# Patient Record
Sex: Male | Born: 1965 | Race: Black or African American | Hispanic: No | Marital: Married | Smoking: Never smoker
Health system: Southern US, Community
[De-identification: ages and names within clinical notes are randomized; demographics above are authoritative.]

## PROBLEM LIST (undated history)

## (undated) DIAGNOSIS — I10 Essential (primary) hypertension: Secondary | ICD-10-CM

## (undated) HISTORY — PX: HERNIA REPAIR: SHX51

---

## 2003-05-11 ENCOUNTER — Ambulatory Visit (HOSPITAL_COMMUNITY): Admission: RE | Admit: 2003-05-11 | Discharge: 2003-05-11 | Payer: Self-pay | Admitting: General Surgery

## 2010-11-04 ENCOUNTER — Inpatient Hospital Stay (INDEPENDENT_AMBULATORY_CARE_PROVIDER_SITE_OTHER)
Admission: RE | Admit: 2010-11-04 | Discharge: 2010-11-04 | Disposition: A | Discharge status: Home / Self Care | Payer: 59 | Source: Ambulatory Visit | Attending: Family Medicine | Admitting: Family Medicine

## 2010-11-04 DIAGNOSIS — T148XXA Other injury of unspecified body region, initial encounter: Secondary | ICD-10-CM

## 2010-11-12 ENCOUNTER — Inpatient Hospital Stay (HOSPITAL_COMMUNITY)
Admission: RE | Admit: 2010-11-12 | Discharge: 2010-11-12 | Disposition: A | Discharge status: Home / Self Care | Payer: 59 | Source: Ambulatory Visit | Attending: Family Medicine | Admitting: Family Medicine

## 2013-09-28 ENCOUNTER — Emergency Department (HOSPITAL_COMMUNITY)
Admission: EM | Admit: 2013-09-28 | Discharge: 2013-09-28 | Disposition: A | Discharge status: Home / Self Care | Payer: Managed Care, Other (non HMO) | Source: Home / Self Care

## 2013-09-28 ENCOUNTER — Encounter (HOSPITAL_COMMUNITY): Payer: Self-pay | Admitting: Emergency Medicine

## 2013-09-28 DIAGNOSIS — S8392XA Sprain of unspecified site of left knee, initial encounter: Secondary | ICD-10-CM

## 2013-09-28 DIAGNOSIS — IMO0002 Reserved for concepts with insufficient information to code with codable children: Secondary | ICD-10-CM

## 2013-09-28 HISTORY — DX: Essential (primary) hypertension: I10

## 2013-09-28 MED ORDER — DICLOFENAC POTASSIUM 50 MG PO TABS: 50.0000 mg | ORAL_TABLET | Freq: Three times a day (TID) | ORAL | Status: DC

## 2013-09-28 MED ORDER — TRAMADOL HCL 50 MG PO TABS: 50.0000 mg | ORAL_TABLET | Freq: Four times a day (QID) | ORAL | Status: DC | PRN

## 2013-09-28 NOTE — ED Provider Notes (Signed)
Medical screening examination/treatment/procedure(s) were performed by resident physician or non-physician practitioner and as supervising physician I was immediately available for consultation/collaboration.   Barkley BrunsKINDL,Braedon Sjogren DOUGLAS MD.   Linna HoffJames D Cassundra Mckeever, MD 09/28/13 [phone]

## 2013-09-28 NOTE — Discharge Instructions (Signed)
Joint Sprain A sprain is a tear or stretch in the ligaments that hold a joint together. Severe sprains may need as long as 3-6 weeks of immobilization and/or exercises to heal completely. Sprained joints should be rested and protected. If not, they can become unstable and prone to re-injury. Proper treatment can reduce your pain, shorten the period of disability, and reduce the risk of repeated injuries. TREATMENT   Rest and elevate the injured joint to reduce pain and swelling.  Apply ice packs to the injury for 20-30 minutes every 2-3 hours for the next 2-3 days.  Keep the injury wrapped in a compression bandage or splint as long as the joint is painful or as instructed by your caregiver.  Do not use the injured joint until it is completely healed to prevent re-injury and chronic instability. Follow the instructions of your caregiver.  Long-term sprain management may require exercises and/or treatment by a physical therapist. Taping or special braces may help stabilize the joint until it is completely better. SEEK MEDICAL CARE IF:   You develop increased pain or swelling of the joint.  You develop increasing redness and warmth of the joint.  You develop a fever.  It becomes stiff.  Your hand or foot gets cold or numb. Document Released: 07/18/2004 Document Revised: 09/02/2011 Document Reviewed: 06/27/2008 [facility] Patient Information 2014 [employer], [place].  Knee Sprain A knee sprain is a tear in one of the strong, fibrous tissues that connect the bones (ligaments) in your knee. The severity of the sprain depends on how much of the ligament is torn. The tear can be either partial or complete. CAUSES  Often, sprains are a result of a fall or injury. The force of the impact causes the fibers of your ligament to stretch too much. This excess tension causes the fibers of your ligament to tear. SIGNS AND SYMPTOMS  You may have some loss of motion in your knee. Other symptoms  include:  Bruising.  Pain in the knee area.  Tenderness of the knee to the touch.  Swelling. DIAGNOSIS  To diagnose a knee sprain, your health care provider will physically examine your knee. Your health care provider may also suggest an X-ray exam of your knee to make sure no bones are broken. TREATMENT  If your ligament is only partially torn, treatment usually involves keeping the knee in a fixed position (immobilization) or bracing your knee for activities that require movement for several weeks. To do this, your health care provider will apply a bandage, cast, or splint to keep your knee from moving and to support your knee during movement until it heals. For a partially torn ligament, the healing process usually takes 4 6 weeks. If your ligament is completely torn, depending on which ligament it is, you may need surgery to reconnect the ligament to the bone or reconstruct it. After surgery, a cast or splint may be applied and will need to stay on your knee for 4 6 weeks while your ligament heals. HOME CARE INSTRUCTIONS  Keep your injured knee elevated to decrease swelling.  To ease pain and swelling, apply ice to the injured area:  Put ice in a plastic bag.  Place a towel between your skin and the bag.  Leave the ice on for 20 minutes, 2 3 times a day.  Only take medicine for pain as directed by your health care provider.  Do not leave your knee unprotected until pain and stiffness go away (usually 4 6 weeks).  If you have a cast or splint, do not allow it to get wet. If you have been instructed not to remove it, cover it with a plastic bag when you shower or bathe. Do not swim.  Your health care provider may suggest exercises for you to do during your recovery to prevent or limit permanent weakness and stiffness. SEEK IMMEDIATE MEDICAL CARE IF:  Your cast or splint becomes damaged.  Your pain becomes worse.  You have significant pain, swelling, or numbness below the cast  or splint. MAKE SURE YOU:  Understand these instructions.  Will watch your condition.  Will get help right away if you are not doing well or get worse. Document Released: 06/10/2005 Document Revised: 03/31/2013 Document Reviewed: 01/20/2013 [facility] Patient Information 2014 [place], [place].

## 2013-09-28 NOTE — ED Notes (Signed)
Reports injury to right knee on 3/28 while bowling.  States heard pop in knee and has been having pain since.  Pt has tried ibuprofen with no relief.

## 2013-09-28 NOTE — ED Provider Notes (Signed)
CSN: [id number]     Arrival date & time 09/28/13  1239 History   First MD Initiated Contact with Patient 09/28/13 1530     Chief Complaint  Patient presents with  . Knee Injury   (Consider location/radiation/quality/duration/timing/severity/associated sxs/prior Treatment) HPI Comments: 48 year old male who was bowling 10 days ago and felt a pop in his left knee. Shortly afterwards he developed pain in the proximal left knee. It has been bothering him ever since. He has been placing ice on the knee and taking NSAIDs.   Past Medical History  Diagnosis Date  . Hypertension    Past Surgical History  Procedure Laterality Date  . Hernia repair     History reviewed. No pertinent family history. History  Substance Use Topics  . Smoking status: Never Smoker   . Smokeless tobacco: Not on file  . Alcohol Use: No    Review of Systems  Constitutional: Negative.   Respiratory: Negative.   Genitourinary: Negative.   Musculoskeletal:       As per HPI  Skin: Negative.   Neurological: Negative for dizziness and numbness.    Allergies  Review of patient's allergies indicates no known allergies.  Home Medications   Current Outpatient Rx  Name  Route  Sig  Dispense  Refill  . lisinopril (PRINIVIL,ZESTRIL) 20 MG tablet   Oral   Take 20 mg by mouth daily.         . diclofenac (CATAFLAM) 50 MG tablet   Oral   Take 1 tablet (50 mg total) by mouth 3 (three) times daily.   21 tablet   0   . traMADol (ULTRAM) 50 MG tablet   Oral   Take 1 tablet (50 mg total) by mouth every 6 (six) hours as needed.   20 tablet   0    BP 155/99  Pulse 63  Temp(Src) 98.2 F (36.8 C) (Oral)  Resp 12  SpO2 100% Physical Exam  Nursing note and vitals reviewed. Constitutional: He is oriented to person, place, and time. He appears well-developed and well-nourished.  HENT:  Head: Normocephalic and atraumatic.  Eyes: EOM are normal.  Neck: Normal range of motion. Neck supple.  Pulmonary/Chest:  Effort normal. No respiratory distress.  Musculoskeletal:  Left knee without deformity, discoloration or swelling. No apparent joint effusion. Medial lateral joint spaces are easily indented. There is tenderness along the distal medial quadriceps tendon. No bony tenderness of the patella or tibia. Normal flexion and extension. No laxity. Negative drawer. Negative varus and negative valgus. Distal neurovascular motor sensory is intact. Pedal pulses 2+.  Neurological: He is alert and oriented to person, place, and time. No cranial nerve deficit.  Skin: Skin is warm and dry.  Psychiatric: He has a normal mood and affect.    ED Course  Procedures (including critical care time) Labs Review Labs Reviewed - No data to display Imaging Review No results found.   MDM   1. Sprain of left knee      Ice, immobilization for a few days Remove periodically for rehab of knee as demo'd. cataflam and tramadol for pain.     Hayden Rasmussenavid Tiwanda Threats, NP 09/28/13 1558

## 2016-06-25 ENCOUNTER — Ambulatory Visit (HOSPITAL_COMMUNITY)
Admission: EM | Admit: 2016-06-25 | Discharge: 2016-06-25 | Disposition: A | Discharge status: Home / Self Care | Payer: Managed Care, Other (non HMO) | Attending: Family Medicine | Admitting: Family Medicine

## 2016-06-25 ENCOUNTER — Encounter (HOSPITAL_COMMUNITY): Payer: Self-pay | Admitting: Emergency Medicine

## 2016-06-25 DIAGNOSIS — A084 Viral intestinal infection, unspecified: Secondary | ICD-10-CM

## 2016-06-25 MED ORDER — ONDANSETRON HCL 4 MG PO TABS: 4.0000 mg | ORAL_TABLET | Freq: Four times a day (QID) | ORAL | 0 refills | Status: DC

## 2016-06-25 NOTE — ED Provider Notes (Signed)
CSN: [id number]     Arrival date & time 06/25/16  1347 History   First MD Initiated Contact with Patient 06/25/16 1504     Chief Complaint  Patient presents with  . Abdominal Pain   (Consider location/radiation/quality/duration/timing/severity/associated sxs/prior Treatment)  51 year old male complaining of mid abdominal cramping/pain for 3 days. Associated with watery diarrhea too numerous to count. He states he has seen no blood in the stool. A couple days ago and started he would feel cold and sweaty but no fever. His also had headache and occasional nausea but no vomiting. He states today the number of stools have decreased to about 4.      Past Medical History:  Diagnosis Date  . Hypertension    Past Surgical History:  Procedure Laterality Date  . HERNIA REPAIR     No family history on file. Social History  Substance Use Topics  . Smoking status: Never Smoker  . Smokeless tobacco: Not on file  . Alcohol use No    Review of Systems  Constitutional: Positive for activity change. Negative for fever.  HENT: Negative.   Respiratory: Negative.   Cardiovascular: Negative for chest pain.  Gastrointestinal: Positive for abdominal pain, diarrhea and nausea. Negative for vomiting.  Musculoskeletal: Negative.   Neurological: Negative.   All other systems reviewed and are negative.   Allergies  Patient has no known allergies.  Home Medications   Prior to Admission medications   Medication Sig Start Date End Date Taking? Authorizing Provider  diclofenac (CATAFLAM) 50 MG tablet Take 1 tablet (50 mg total) by mouth 3 (three) times daily. 09/28/13   Hayden Rasmussenavid Bland Rudzinski, NP  lisinopril (PRINIVIL,ZESTRIL) 20 MG tablet Take 20 mg by mouth daily.    Historical Provider, MD  ondansetron (ZOFRAN) 4 MG tablet Take 1 tablet (4 mg total) by mouth every 6 (six) hours. 06/25/16   Hayden Rasmussenavid Kasper Mudrick, NP  traMADol (ULTRAM) 50 MG tablet Take 1 tablet (50 mg total) by mouth every 6 (six) hours as needed. 09/28/13    Hayden Rasmussenavid Dodi Leu, NP   Meds Ordered and Administered this Visit  Medications - No data to display  BP 141/71   Pulse 79   Temp 98.5 F (36.9 C)   Resp 16   SpO2 98%  No data found.   Physical Exam  Constitutional: He is oriented to person, place, and time. He appears well-developed and well-nourished. No distress.  HENT:  Head: Normocephalic and atraumatic.  Neck: Neck supple.  Cardiovascular: Normal rate and regular rhythm.   Pulmonary/Chest: Effort normal and breath sounds normal.  Abdominal: Soft. Bowel sounds are normal. There is tenderness. There is no rebound and no guarding.  Tenderness across the left abdomen and periumbilical. Less tenderness to the epigastrium. No right lower quadrant tenderness.  Musculoskeletal: Normal range of motion. He exhibits no edema.  Neurological: He is alert and oriented to person, place, and time.  Skin: Skin is warm and dry.  Nursing note and vitals reviewed.   Urgent Care Course   Clinical Course     Procedures (including critical care time)  Labs Review Labs Reviewed - No data to display  Imaging Review No results found.   Visual Acuity Review  Right Eye Distance:   Left Eye Distance:   Bilateral Distance:    Right Eye Near:   Left Eye Near:    Bilateral Near:         MDM   1. Viral gastroenteritis    Read attached instructions. Drink the Pedialyte  as directed. Try to consume at least 2 quarts in the next 24 hours in addition to other liquids or Jell-O. Started out with small amounts at a time. No greasy, spicy, fatty or fast foods in the next 24-36 hours. Slowly advance diet as discussed.  Take Zofran as needed for nausea. You may use Imodium 1 tablet now and then in 4 hours if you have had 3-4 more stools take another one. Do not use the instructions on the box, that is too much you do not want to stop the diarrhea just slow it down. Meds ordered this encounter  Medications  . ondansetron (ZOFRAN) 4 MG tablet     Sig: Take 1 tablet (4 mg total) by mouth every 6 (six) hours.    Dispense:  12 tablet    Refill:  0    Order Specific Question:   Supervising Provider    Answer:   Micheline Chapman       Hayden Rasmussen, NP 06/25/16 1531

## 2016-06-25 NOTE — ED Triage Notes (Signed)
Triaged by Provider. 

## 2016-06-25 NOTE — Discharge Instructions (Signed)
Read attached instructions. Drink the Pedialyte as directed. Try to consume at least 2 quarts in the next 24 hours in addition to other liquids or Jell-O. Started out with small amounts at a time. No greasy, spicy, fatty or fast foods in the next 24-36 hours. Slowly advance diet as discussed.  Take Zofran as needed for nausea. You may use Imodium 1 tablet now and then in 4 hours if you have had 3-4 more stools take another one. Do not use the instructions on the box, that is too much you do not want to stop the diarrhea just slow it down.

## 2017-11-19 ENCOUNTER — Ambulatory Visit (HOSPITAL_COMMUNITY)
Admission: EM | Admit: 2017-11-19 | Discharge: 2017-11-19 | Disposition: A | Discharge status: Home / Self Care | Payer: 59 | Attending: Family Medicine | Admitting: Family Medicine

## 2017-11-19 ENCOUNTER — Encounter (HOSPITAL_COMMUNITY): Payer: Self-pay | Admitting: Emergency Medicine

## 2017-11-19 DIAGNOSIS — R51 Headache: Secondary | ICD-10-CM | POA: Diagnosis not present

## 2017-11-19 DIAGNOSIS — R42 Dizziness and giddiness: Secondary | ICD-10-CM | POA: Diagnosis not present

## 2017-11-19 DIAGNOSIS — R519 Headache, unspecified: Secondary | ICD-10-CM

## 2017-11-19 MED ORDER — MELOXICAM 7.5 MG PO TABS: 7.5000 mg | ORAL_TABLET | Freq: Every day | ORAL | 0 refills | Status: DC

## 2017-11-19 MED ORDER — FLUTICASONE PROPIONATE 50 MCG/ACT NA SUSP: 2.0000 | Freq: Every day | NASAL | 0 refills | Status: DC

## 2017-11-19 MED ORDER — OMEPRAZOLE 20 MG PO CPDR: 20.0000 mg | DELAYED_RELEASE_CAPSULE | Freq: Every day | ORAL | 0 refills | Status: DC

## 2017-11-19 MED ORDER — KETOROLAC TROMETHAMINE 60 MG/2ML IM SOLN
60.0000 mg | Freq: Once | INTRAMUSCULAR | Status: AC
  Administered 2017-11-19: 60 mg via INTRAMUSCULAR

## 2017-11-19 MED ORDER — KETOROLAC TROMETHAMINE 60 MG/2ML IM SOLN
INTRAMUSCULAR | Status: AC
  Filled 2017-11-19: qty 2

## 2017-11-19 NOTE — ED Provider Notes (Signed)
[facility]    CSN: [id number] Arrival date & time: 11/19/17  1659     History   Chief Complaint Chief Complaint  Patient presents with  . Headache  . Dizziness    HPI William Mccall is a 52 y.o. male.   52 year old male comes in with 5-day history of headache.  Patient states this started as left-sided, pounding that was severe, and intensity has slowly declined throughout the 5 days, but increased today.  States now headache feels more  eye gets behind the left eye, that is stool in nature.  Has intermittent blurry vision while working.  States started having dizziness today, describes it as "woozy", as if he wants to sit down because the room is spinning.  Nausea without vomiting that has since resolved.  Does feel as if he has acid reflux with burning sensation in the epigastric region.  He denies chest pain, shortness of breath, wheezing.  Admits to intermittent palpitations since symptoms started, but does not know any aggravating or alleviating factor.  Denies current palpitations.  Denies urinary symptoms such as cough, congestion, sore throat.  Denies fever, chills, night sweats.  Denies weakness,  syncope.  Denies personal history of heart disease.  Denies family history of heart disease.  States when headache first started, he checked his blood pressure, which was at 178/105.  States he has been taking lisinopril-HCTZ with good compliance.  Take Tylenol without relief.  Denies photophobia, eye crusting.  Denies contact lens use, does wear glasses.     Past Medical History:  Diagnosis Date  . Hypertension     There are no active problems to display for this patient.   Past Surgical History:  Procedure Laterality Date  . HERNIA REPAIR         Home Medications    Prior to Admission medications   Medication Sig Start Date End Date Taking? Authorizing Provider  lisinopril-hydrochlorothiazide (PRINZIDE,ZESTORETIC) 20-25 MG tablet Take 1 tablet by mouth  daily.   Yes [provider]  sertraline (ZOLOFT) 50 MG tablet Take 50 mg by mouth daily.   Yes [provider]  diclofenac (CATAFLAM) 50 MG tablet Take 1 tablet (50 mg total) by mouth 3 (three) times daily. 09/28/13   Hayden Rasmussen, NP  fluticasone (FLONASE) 50 MCG/ACT nasal spray Place 2 sprays into both nostrils daily. 11/19/17   Cathie Hoops, Amy V, PA-C  meloxicam (MOBIC) 7.5 MG tablet Take 1 tablet (7.5 mg total) by mouth daily. 11/19/17   Cathie Hoops, Amy V, PA-C  omeprazole (PRILOSEC) 20 MG capsule Take 1 capsule (20 mg total) by mouth daily. 11/19/17   Cathie Hoops, Amy V, PA-C  ondansetron (ZOFRAN) 4 MG tablet Take 1 tablet (4 mg total) by mouth every 6 (six) hours. 06/25/16   Hayden Rasmussen, NP  traMADol (ULTRAM) 50 MG tablet Take 1 tablet (50 mg total) by mouth every 6 (six) hours as needed. 09/28/13   Hayden Rasmussen, NP    Family History No family history on file.  Social History Social History   Tobacco Use  . Smoking status: Never Smoker  Substance Use Topics  . Alcohol use: No  . Drug use: No     Allergies   Patient has no known allergies.   Review of Systems Review of Systems  Reason unable to perform ROS: See HPI as above.     Physical Exam Triage Vital Signs ED Triage Vitals  Enc Vitals Group     BP 11/19/17 1710 (!)  143/80     Pulse Rate 11/19/17 1710 74     Resp 11/19/17 1710 16     Temp 11/19/17 1710 98.3 F (36.8 C)     Temp src --      SpO2 11/19/17 1710 98 %     Weight --      Height --      Head Circumference --      Peak Flow --      Pain Score 11/19/17 1712 2     Pain Loc --      Pain Edu? --      Excl. in GC? --    No data found.  Updated Vital Signs BP (!) 143/80   Pulse 74   Temp 98.3 F (36.8 C)   Resp 16   SpO2 98%   Visual Acuity Right Eye Distance:   20/20 (CC) Left Eye Distance:   20/30 (CC) Bilateral Distance:    Right Eye Near:   Left Eye Near:    Bilateral Near:     Physical Exam  Constitutional: He is oriented to person, place, and  time. He appears well-developed and well-nourished. No distress.  HENT:  Head: Normocephalic and atraumatic.  Right Ear: Tympanic membrane, external ear and ear canal normal. Tympanic membrane is not erythematous and not bulging.  Left Ear: Tympanic membrane, external ear and ear canal normal. Tympanic membrane is not erythematous and not bulging.  Nose: Right sinus exhibits no maxillary sinus tenderness and no frontal sinus tenderness. Left sinus exhibits maxillary sinus tenderness and frontal sinus tenderness.  Mouth/Throat: Uvula is midline, oropharynx is clear and moist and mucous membranes are normal.  Eyes: Pupils are equal, round, and reactive to light. Conjunctivae are normal.  Neck: Normal range of motion. Neck supple.  Cardiovascular: Normal rate, regular rhythm and normal heart sounds. Exam reveals no gallop and no friction rub.  No murmur heard. Pulmonary/Chest: Effort normal and breath sounds normal. He has no decreased breath sounds. He has no wheezes. He has no rhonchi. He has no rales.  Abdominal: Soft. He exhibits no distension and no mass. There is no tenderness. There is no guarding.  Lymphadenopathy:    He has no cervical adenopathy.  Neurological: He is alert and oriented to person, place, and time. He has normal strength. He is not disoriented. No cranial nerve deficit or sensory deficit. He displays a negative Romberg sign. Coordination and gait normal. GCS eye subscore is 4. GCS verbal subscore is 5. GCS motor subscore is 6.  Normal finger-to-nose, rapid movement.  Skin: Skin is warm and dry.  Psychiatric: He has a normal mood and affect. His behavior is normal. Judgment normal.     UC Treatments / Results  Labs (all labs ordered are listed, but only abnormal results are displayed) Labs Reviewed - No data to display  EKG None  Radiology No results found.  Procedures Procedures (including critical care time)  Medications Ordered in UC Medications    ketorolac (TORADOL) injection 60 mg (60 mg Intramuscular Given 11/19/17 1853)    Initial Impression / Assessment and Plan / UC Course  I have reviewed the triage vital signs and the nursing notes.  Pertinent labs & imaging results that were available during my care of the patient were reviewed by me and considered in my medical decision making (see chart for details).    No alarming signs on exam, normal neurology exam.  EKG normal sinus rhythm, 70 bpm, no acute ST changes.  Discussed possible sinus pressure given tenderness to palpation.  Start Flonase as directed.  Toradol injection in office today.  Push fluids.  Monitor  closely.  Strict return precautions given.  Patient expresses understanding and agrees to plan.  Final Clinical Impressions(s) / UC Diagnoses   Final diagnoses:  Acute intractable headache, unspecified headache type    ED Prescriptions    Medication Sig Dispense Auth. Provider   meloxicam (MOBIC) 7.5 MG tablet Take 1 tablet (7.5 mg total) by mouth daily. 15 tablet Yu, Amy V, PA-C   omeprazole (PRILOSEC) 20 MG capsule Take 1 capsule (20 mg total) by mouth daily. 15 capsule Yu, Amy V, PA-C   fluticasone (FLONASE) 50 MCG/ACT nasal spray Place 2 sprays into both nostrils daily. 1 g Threasa Alpha, PA-C 11/20/17 1005

## 2017-11-19 NOTE — ED Triage Notes (Signed)
Pt c/o headache for the last few days, on Sunday he took his bp and it was 170s. Pt states today at work his head still has been hurting and his vision is blurred. Pt also feels a little lightheaded. Ambulatory with steady gait.

## 2017-11-19 NOTE — Discharge Instructions (Signed)
No alarming signs on your exam. EKG and neurology exam normal. Toradol injection to help with pain. Mobic as needed for headache. Omeprazole for acid reflux. Flonase to help with nasal congestion/sinus pressure. If experiencing worsening of symptoms, worsening headache/blurry vision, nausea/vomiting, confusion/altered mental status, dizziness, weakness, passing out, imbalance, chest pain, shortness of breath, palpitations, go to the emergency department for further evaluation.

## 2017-12-24 ENCOUNTER — Emergency Department (HOSPITAL_COMMUNITY)
Admission: EM | Admit: 2017-12-24 | Discharge: 2017-12-24 | Disposition: A | Discharge status: Home / Self Care | Payer: 59 | Attending: Emergency Medicine | Admitting: Emergency Medicine

## 2017-12-24 ENCOUNTER — Other Ambulatory Visit: Payer: Self-pay

## 2017-12-24 ENCOUNTER — Emergency Department (HOSPITAL_COMMUNITY): Payer: 59

## 2017-12-24 ENCOUNTER — Encounter (HOSPITAL_COMMUNITY): Payer: Self-pay | Admitting: Emergency Medicine

## 2017-12-24 DIAGNOSIS — R519 Headache, unspecified: Secondary | ICD-10-CM

## 2017-12-24 DIAGNOSIS — I10 Essential (primary) hypertension: Secondary | ICD-10-CM | POA: Insufficient documentation

## 2017-12-24 DIAGNOSIS — R51 Headache: Secondary | ICD-10-CM | POA: Diagnosis not present

## 2017-12-24 DIAGNOSIS — G44229 Chronic tension-type headache, not intractable: Secondary | ICD-10-CM

## 2017-12-24 LAB — COMPREHENSIVE METABOLIC PANEL
ALT: 47 U/L — ABNORMAL HIGH (ref 0–44)
AST: 40 U/L (ref 15–41)
Albumin: 4.2 g/dL (ref 3.5–5.0)
Alkaline Phosphatase: 56 U/L (ref 38–126)
Anion gap: 9 (ref 5–15)
BUN: 15 mg/dL (ref 6–20)
CO2: 31 mmol/L (ref 22–32)
Calcium: 9.9 mg/dL (ref 8.9–10.3)
Chloride: 101 mmol/L (ref 98–111)
Creatinine, Ser: 1.02 mg/dL (ref 0.61–1.24)
GFR calc Af Amer: >60 mL/min (ref >60)
GFR calc non Af Amer: >60 mL/min (ref >60)
Glucose, Bld: 83 mg/dL (ref 70–99)
Potassium: 3.6 mmol/L (ref 3.5–5.1)
Sodium: 141 mmol/L (ref 135–145)
Total Bilirubin: 0.7 mg/dL (ref 0.3–1.2)
Total Protein: 7.4 g/dL (ref 6.5–8.1)

## 2017-12-24 LAB — CBC WITH DIFFERENTIAL/PLATELET
Abs Immature Granulocytes: 0 10*3/uL (ref 0.0–0.1)
Basophils Absolute: 0 10*3/uL (ref 0.0–0.1)
Basophils Relative: 1 %
Eosinophils Absolute: 0.3 10*3/uL (ref 0.0–0.7)
Eosinophils Relative: 4 %
HCT: 43 % (ref 39.0–52.0)
Hemoglobin: 13.5 g/dL (ref 13.0–17.0)
Immature Granulocytes: 1 %
Lymphocytes Relative: 31 %
Lymphs Abs: 2.1 10*3/uL (ref 0.7–4.0)
MCH: 30.8 pg (ref 26.0–34.0)
MCHC: 31.4 g/dL (ref 30.0–36.0)
MCV: 98.2 fL (ref 78.0–100.0)
Monocytes Absolute: 0.7 10*3/uL (ref 0.1–1.0)
Monocytes Relative: 11 %
Neutro Abs: 3.5 10*3/uL (ref 1.7–7.7)
Neutrophils Relative %: 52 %
Platelets: 251 10*3/uL (ref 150–400)
RBC: 4.38 MIL/uL (ref 4.22–5.81)
RDW: 13.4 % (ref 11.5–15.5)
WBC: 6.6 10*3/uL (ref 4.0–10.5)

## 2017-12-24 LAB — MAGNESIUM: Magnesium: 2.1 mg/dL (ref 1.7–2.4)

## 2017-12-24 MED ORDER — ACETAMINOPHEN 500 MG PO TABS
1000.0000 mg | ORAL_TABLET | Freq: Once | ORAL | Status: AC
  Administered 2017-12-24: 1000 mg via ORAL
  Filled 2017-12-24: qty 2

## 2017-12-24 MED ORDER — METOCLOPRAMIDE HCL 5 MG/ML IJ SOLN
10.0000 mg | Freq: Once | INTRAMUSCULAR | Status: AC
  Administered 2017-12-24: 10 mg via INTRAVENOUS
  Filled 2017-12-24: qty 2

## 2017-12-24 MED ORDER — DEXAMETHASONE SODIUM PHOSPHATE 10 MG/ML IJ SOLN
10.0000 mg | Freq: Once | INTRAMUSCULAR | Status: AC
  Administered 2017-12-24: 10 mg via INTRAVENOUS
  Filled 2017-12-24: qty 1

## 2017-12-24 MED ORDER — SODIUM CHLORIDE 0.9 % IV BOLUS
1000.0000 mL | Freq: Once | INTRAVENOUS | Status: AC
  Administered 2017-12-24: 1000 mL via INTRAVENOUS

## 2017-12-24 MED ORDER — IOPAMIDOL (ISOVUE-370) INJECTION 76%
INTRAVENOUS | Status: AC
  Filled 2017-12-24: qty 50

## 2017-12-24 MED ORDER — IOPAMIDOL (ISOVUE-370) INJECTION 76%
50.0000 mL | Freq: Once | INTRAVENOUS | Status: AC | PRN
Start: 2017-12-24 — End: 2017-12-24
  Administered 2017-12-24: 50 mL via INTRAVENOUS

## 2017-12-24 NOTE — Discharge Instructions (Signed)
Your MRI is normal. You should follow up with Neurology and a primary care doctor. There does not appear to be any emergent cause of your headache.  You are having a headache. No specific cause was found today for your headache. It may have been a migraine or other cause of headache. Stress, anxiety, fatigue, and depression are common triggers for headaches. Your headache today does not appear to be life-threatening or require hospitalization, but often the exact cause of headaches is not determined in the emergency department. Therefore, follow-up with your doctor is very important to find out what may have caused your headache, and whether or not you need any further diagnostic testing or treatment. Sometimes headaches can appear benign (not harmful), but then more serious symptoms can develop which should prompt an immediate re-evaluation by your doctor or the emergency department. SEEK MEDICAL ATTENTION IF: You develop possible problems with medications prescribed.  The medications don't resolve your headache, if it recurs , or if you have multiple episodes of vomiting or can't take fluids. You have a change from the usual headache. RETURN IMMEDIATELY IF you develop a sudden, severe headache or confusion, become poorly responsive or faint, develop a fever above 100.84F or problem breathing, have a change in speech, vision, swallowing, or understanding, or develop new weakness, numbness, tingling, incoordination, or have a seizure.

## 2017-12-24 NOTE — ED Notes (Signed)
Patient Alert and oriented to baseline. Stable and ambulatory to baseline. Patient verbalized understanding of the discharge instructions.  Patient belongings were taken by the patient.   

## 2017-12-24 NOTE — ED Notes (Signed)
Patient transported to MRI 

## 2017-12-24 NOTE — ED Provider Notes (Signed)
  3:33 PM BP 127/84 (BP Location: Left Arm)   Pulse 71   Temp 98.4 F (36.9 C) (Oral)   Resp 16   SpO2 98%  Patient with HA x 1 month and dizziness. + romberg Patient MRI pending. If negative d/c with pcp/ neuro follow up.    Patient MRI negative and headache is resolved.  Patient referred to OP PCP and NEURO.  Pt HA treated and improved while in ED.  Presentation is not concerning for [facility] Medical SystemsAH, ICH, Meningitis, or temporal arteritis. Pt is afebrile with no focal neuro deficits, nuchal rigidity, or change in vision. Pt is to follow up with PCP . Pt verbalizes understanding and is agreeable with plan to dc.       Arthor CaptainHarris, Johnrobert Foti, PA-C 12/24/17 2356    Tegeler, Canary Brimhristopher J, MD 12/25/17 [phone]

## 2017-12-24 NOTE — ED Provider Notes (Signed)
[facility] EMERGENCY DEPARTMENT Provider Note   CSN: [id number] Arrival date & time: 12/24/17  0945     History   Chief Complaint Chief Complaint  Patient presents with  . Headache    HPI William Mccall is a 52 y.o. male.  HPI   William Mccall is a 52 y.o. male, with a history of HTN, presenting to the ED with headache beginning the last weekend in May 2019. Pain is frequent, hurts more often than not, left sided, throbbing, 7/10, radiating down the left side of the neck. Constant pressure in frontal region.  Accompanied by dizziness, blurred vision, and double vision on the left.   He has been treated for a sinus infection and has tried ibuprofen, Fioricet, baclofen, and prednisone. He has been seen at his PCP and UC.  Ibuprofen, Fioricet, and baclofen improve the pain temporarily, but do not resolve it.  He also notes he sustained a tick bite on April 13.   Denies syncope, falls/trauma, vision loss, focal weakness, numbness, facial droop, seizures, rashes, fever/chills, or any other complaints.      Past Medical History:  Diagnosis Date  . Hypertension     There are no active problems to display for this patient.   Past Surgical History:  Procedure Laterality Date  . HERNIA REPAIR          Home Medications    Prior to Admission medications   Medication Sig Start Date End Date Taking? Authorizing Provider  diclofenac (CATAFLAM) 50 MG tablet Take 1 tablet (50 mg total) by mouth 3 (three) times daily. 09/28/13   Hayden Rasmussen, NP  fluticasone (FLONASE) 50 MCG/ACT nasal spray Place 2 sprays into both nostrils daily. 11/19/17   Cathie Hoops, Amy V, PA-C  lisinopril-hydrochlorothiazide (PRINZIDE,ZESTORETIC) 20-25 MG tablet Take 1 tablet by mouth daily.    [provider]  meloxicam (MOBIC) 7.5 MG tablet Take 1 tablet (7.5 mg total) by mouth daily. 11/19/17   Cathie Hoops, Amy V, PA-C  omeprazole (PRILOSEC) 20 MG capsule Take 1 capsule (20 mg total) by mouth daily.  11/19/17   Cathie Hoops, Amy V, PA-C  ondansetron (ZOFRAN) 4 MG tablet Take 1 tablet (4 mg total) by mouth every 6 (six) hours. 06/25/16   Hayden Rasmussen, NP  sertraline (ZOLOFT) 50 MG tablet Take 50 mg by mouth daily.    [provider]  traMADol (ULTRAM) 50 MG tablet Take 1 tablet (50 mg total) by mouth every 6 (six) hours as needed. 09/28/13   Hayden Rasmussen, NP    Family History No family history on file.  Social History Social History   Tobacco Use  . Smoking status: Never Smoker  Substance Use Topics  . Alcohol use: No  . Drug use: No     Allergies   Patient has no known allergies.   Review of Systems Review of Systems  Constitutional: Negative for chills, diaphoresis and fever.  Eyes: Positive for visual disturbance.  Respiratory: Negative for shortness of breath.   Cardiovascular: Negative for chest pain.  Gastrointestinal: Positive for nausea. Negative for abdominal pain, diarrhea and vomiting.  Musculoskeletal: Positive for neck pain. Negative for back pain.  Neurological: Positive for dizziness and headaches. Negative for seizures, syncope, facial asymmetry, weakness and numbness.  All other systems reviewed and are negative.    Physical Exam Updated Vital Signs BP 127/84 (BP Location: Left Arm)   Pulse 71   Temp 98.4 F (36.9 C) (Oral)   Resp 16   SpO2  98%   Physical Exam  Constitutional: He is oriented to person, place, and time. He appears well-developed and well-nourished. No distress.  HENT:  Head: Normocephalic and atraumatic.  Right Ear: Tympanic membrane, external ear and ear canal normal.  Left Ear: Tympanic membrane, external ear and ear canal normal.  Mouth/Throat: Oropharynx is clear and moist.  Eyes: Pupils are equal, round, and reactive to light. Conjunctivae and EOM are normal.  Neck: Neck supple.  Cardiovascular: Normal rate, regular rhythm, normal heart sounds and intact distal pulses.  Pulmonary/Chest: Effort normal and breath sounds normal. No  respiratory distress.  Abdominal: Soft. There is no tenderness. There is no guarding.  Musculoskeletal: He exhibits no edema.  Lymphadenopathy:    He has no cervical adenopathy.  Neurological: He is alert and oriented to person, place, and time.  Positive romberg. Some unsteadiness to patient's gait, tends start to fall to the left, but able to correct himself.  Sensation grossly intact in the extremities. No noted speech deficits. No aphasia. Patient handles oral secretions without difficulty. No noted swallowing defects.  Equal grip strength bilaterally. Strength 5/5 in the upper extremities. Strength 5/5 with flexion and extension of the hips, knees, and ankles bilaterally.  Patellar DTRs 2+ bilaterally. Coordination intact including heel to shin and finger to nose.  Cranial nerves III-XII grossly intact.  No facial droop.   Skin: Skin is warm and dry. He is not diaphoretic.  Psychiatric: He has a normal mood and affect. His behavior is normal.  Nursing note and vitals reviewed.    ED Treatments / Results  Labs (all labs ordered are listed, but only abnormal results are displayed) Labs Reviewed  COMPREHENSIVE METABOLIC PANEL - Abnormal; Notable for the following components:      Result Value   ALT 47 (*)    All other components within normal limits  CBC WITH DIFFERENTIAL/PLATELET  MAGNESIUM  RPR  HIV ANTIBODY (ROUTINE TESTING)    EKG None  Radiology Ct Angio Head W Or Wo Contrast  Result Date: 12/24/2017 CLINICAL DATA:  Headache, focal deficit, or papilledema. Chart review notes persistent headache that has fluctuated since memorial day. EXAM: CT ANGIOGRAPHY HEAD AND NECK TECHNIQUE: Multidetector CT imaging of the head and neck was performed using the standard protocol during bolus administration of intravenous contrast. Multiplanar CT image reconstructions and MIPs were obtained to evaluate the vascular anatomy. Carotid stenosis measurements (when applicable) are  obtained utilizing NASCET criteria, using the distal internal carotid diameter as the denominator. CONTRAST:  50mL ISOVUE-370 IOPAMIDOL (ISOVUE-370) INJECTION 76% COMPARISON:  None. FINDINGS: CT HEAD FINDINGS Brain: No evidence of acute infarction, hemorrhage, hydrocephalus, extra-axial collection or mass lesion/mass effect. Scattered dural calcifications, nonspecific. Vascular: See below Skull: Negative Sinuses: Clear visualized sinuses. Orbits: Negative CTA NECK FINDINGS Aortic arch: Normal where minimally covered. Two vessel branching pattern. Right carotid system: Vessels are smooth and widely patent. No atheromatous changes. Left carotid system: Vessels are smooth and widely patent. Minimal calcified plaque at the common carotid bifurcation. Vertebral arteries: No proximal subclavian stenosis. Both vertebral arteries are smooth and widely patent. Skeleton: No acute or aggressive finding. 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 Upper chest: Negative Review of the MIP images confirms the above findings CTA HEAD FINDINGS Anterior circulation: Hypoplastic right A1 segment. No branch occlusion, beading, aneurysm, or stenosis Posterior circulation: Vertebrobasilar arteries are smooth and diffusely patent. Robust flow in bilateral posterior cerebral arteries. Negative for aneurysm or beading Venous sinuses: Patent Anatomic variants: As above  Delayed phase: No abnormal intracranial enhancement Review of the MIP images confirms the above findings IMPRESSION: Negative exam.  No explanation for headache. Electronically Signed   By: Marnee Spring M.D.   On: 12/24/2017 13:07   Ct Angio Neck W And/or Wo Contrast  Result Date: 12/24/2017 CLINICAL DATA:  Headache, focal deficit, or papilledema. Chart review notes persistent headache that has fluctuated since memorial day. EXAM: CT ANGIOGRAPHY HEAD AND NECK TECHNIQUE: Multidetector CT imaging of the head and neck was performed using  the standard protocol during bolus administration of intravenous contrast. Multiplanar CT image reconstructions and MIPs were obtained to evaluate the vascular anatomy. Carotid stenosis measurements (when applicable) are obtained utilizing NASCET criteria, using the distal internal carotid diameter as the denominator. CONTRAST:  50mL ISOVUE-370 IOPAMIDOL (ISOVUE-370) INJECTION 76% COMPARISON:  None. FINDINGS: CT HEAD FINDINGS Brain: No evidence of acute infarction, hemorrhage, hydrocephalus, extra-axial collection or mass lesion/mass effect. Scattered dural calcifications, nonspecific. Vascular: See below Skull: Negative Sinuses: Clear visualized sinuses. Orbits: Negative CTA NECK FINDINGS Aortic arch: Normal where minimally covered. Two vessel branching pattern. Right carotid system: Vessels are smooth and widely patent. No atheromatous changes. Left carotid system: Vessels are smooth and widely patent. Minimal calcified plaque at the common carotid bifurcation. Vertebral arteries: No proximal subclavian stenosis. Both vertebral arteries are smooth and widely patent. Skeleton: No acute or aggressive finding. 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 Upper chest: Negative Review of the MIP images confirms the above findings CTA HEAD FINDINGS Anterior circulation: Hypoplastic right A1 segment. No branch occlusion, beading, aneurysm, or stenosis Posterior circulation: Vertebrobasilar arteries are smooth and diffusely patent. Robust flow in bilateral posterior cerebral arteries. Negative for aneurysm or beading Venous sinuses: Patent Anatomic variants: As above Delayed phase: No abnormal intracranial enhancement Review of the MIP images confirms the above findings IMPRESSION: Negative exam.  No explanation for headache. Electronically Signed   By: Marnee Spring M.D.   On: 12/24/2017 13:07    Procedures Procedures (including critical care time)  Medications Ordered in  ED Medications  iopamidol (ISOVUE-370) 76 % injection (has no administration in time range)  sodium chloride 0.9 % bolus 1,000 mL (1,000 mLs Intravenous New Bag/Given 12/24/17 1133)  metoCLOPramide (REGLAN) injection 10 mg (10 mg Intravenous Given 12/24/17 1124)  dexamethasone (DECADRON) injection 10 mg (10 mg Intravenous Given 12/24/17 1125)  acetaminophen (TYLENOL) tablet 1,000 mg (1,000 mg Oral Given 12/24/17 1121)  iopamidol (ISOVUE-370) 76 % injection 50 mL (50 mLs Intravenous Contrast Given 12/24/17 1245)     Initial Impression / Assessment and Plan / ED Course  I have reviewed the triage vital signs and the nursing notes.  Pertinent labs & imaging results that were available during my care of the patient were reviewed by me and considered in my medical decision making (see chart for details).  Clinical Course as of Dec 24 1537  Wed Dec 24, 2017  1200 Patient's headache resolved.  Continues to have some dizziness.   [SJ]  1400 States his headache continues to be resolved. On repeat exam, patient still has some steadiness with Romberg.   [SJ]    Clinical Course User Index [SJ] Makaya Juneau C, PA-C    Patient with persistent headache for at least the last month.  Accompanied by unilateral vision abnormalities as well as dizziness with positive romberg.   End of shift patient care handoff report given to Arthor Captain, PA-C. Plan: Patient awaiting MRI.  If normal, ambulate patient for safety.  Discharged with neurology follow-up.  Suggest Lyme titer through PCP.  Findings and plan of care discussed with Gwyneth SproutWhitney Plunkett, MD.   Final Clinical Impressions(s) / ED Diagnoses   Final diagnoses:  Persistent headaches    ED Discharge Orders    None       Concepcion LivingJoy, Nathon Stefanski C, PA-C 12/24/17 1540    Gwyneth SproutPlunkett, Whitney, MD 12/25/17 [phone]

## 2017-12-24 NOTE — ED Triage Notes (Addendum)
Patient complains of a persistent headache that varies in severity that started Memorial Day weekend this year. Alert, oriented, ambulating independently with steady gait.  Patient states he was recently went to Urgent Care and received treatment for a sinus infection but symptoms are unchanged.

## 2017-12-25 LAB — RPR: RPR Ser Ql: NONREACTIVE

## 2017-12-25 LAB — HIV ANTIBODY (ROUTINE TESTING W REFLEX): HIV Screen 4th Generation wRfx: NONREACTIVE

## 2018-01-16 ENCOUNTER — Other Ambulatory Visit: Payer: Self-pay | Admitting: Neurology

## 2018-01-16 ENCOUNTER — Other Ambulatory Visit: Payer: 59

## 2018-01-16 ENCOUNTER — Ambulatory Visit (INDEPENDENT_AMBULATORY_CARE_PROVIDER_SITE_OTHER): Payer: 59 | Admitting: Neurology

## 2018-01-16 ENCOUNTER — Encounter: Payer: Self-pay | Admitting: Neurology

## 2018-01-16 VITALS — BP 126/81 | HR 65 | Ht 67.0 in | Wt 198.0 lb

## 2018-01-16 DIAGNOSIS — G43001 Migraine without aura, not intractable, with status migrainosus: Secondary | ICD-10-CM | POA: Diagnosis not present

## 2018-01-16 DIAGNOSIS — R51 Headache: Secondary | ICD-10-CM | POA: Diagnosis not present

## 2018-01-16 DIAGNOSIS — G8929 Other chronic pain: Secondary | ICD-10-CM

## 2018-01-16 MED ORDER — TOPIRAMATE 50 MG PO TABS: 50.0000 mg | ORAL_TABLET | Freq: Two times a day (BID) | ORAL | 11 refills | Status: DC

## 2018-01-16 NOTE — Progress Notes (Signed)
[facility]    Provider:  Dr Lucia Gaskins Referring Provider: [facility] Primary Care Physician:  [facility]  CC:  headache  HPI:  William Mccall is a 52 y.o. male here as a referral for headache.  He is here with his wife. PMHx HTN, OSA on cpap managed by the [facility] but reports mild sleep apnea. Headache started memorial day weekend. He stared having a throbbing headache, left side of the head, severe painful throbbing. He has daily headache, dizziness, nausea, "loopy daily". He feels his peripheral vision is "gone", his vision is blurred, he sees double vision at times offset. If he is laying down in bed he has to close one eye. Hi balance is "ooff" he can just lose his balance. No inciting events except he started taking Zoloft. He stopped the zoloft 2 days ago. He has light and sound sensitivity, nausea with the headaches. He has to ask people to dim the lights which helps. He wakes with headaches,  No FHx of migraines. Movement makes it worse. No aura. No numbness or tingling. He also has pressure behind his eyes. He has episodes of double vision.  He has had similar headaches in the past, he had the worst headache on memorial day, daily headaches and pressure in his head. He has a 6/10 headache today, appears very uncomfortable. Smokes marijuana, + hx of alcohol use now only social.    Reviewed notes, labs and imaging from outside physicians, which showed:  MRI brain normal, personally reviewed imaging 12/24/2017  CTA head/neck: reviewed reports: Negative exams  CMP elevated ALT 47 otherwise normal, rpr/hiv negative  Review of Systems: Patient complains of symptoms per HPI as well as the following symptoms: blurred vision, double vision, eye pain. Pertinent negatives and positives per HPI. All others negative.   Social History   Socioeconomic History  . Marital status: Married    Spouse name: Not on file  . Number of children: Not on file  . Years of  education: Not on file  . Highest education level: Not on file  Occupational History  . Not on file  Social Needs  . Financial resource strain: Not on file  . Food insecurity:    Worry: Not on file    Inability: Not on file  . Transportation needs:    Medical: Not on file    Non-medical: Not on file  Tobacco Use  . Smoking status: Never Smoker  . Smokeless tobacco: Never Used  Substance and Sexual Activity  . Alcohol use: Yes    Comment: occasional  . Drug use: Yes    Types: Marijuana  . Sexual activity: Yes  Lifestyle  . Physical activity:    Days per week: Not on file    Minutes per session: Not on file  . Stress: Not on file  Relationships  . Social connections:    Talks on phone: Not on file    Gets together: Not on file    Attends religious service: Not on file    Active member of club or organization: Not on file    Attends meetings of clubs or organizations: Not on file    Relationship status: Not on file  . Intimate partner violence:    Fear of current or ex partner: Not on file    Emotionally abused: Not on file    Physically abused: Not on file    Forced sexual activity: Not on file  Other Topics Concern  . Not on file  Social History Narrative  . Not on file    Family History  Problem Relation Age of Onset  . Migraines Neg Hx     Past Medical History:  Diagnosis Date  . Hypertension     Past Surgical History:  Procedure Laterality Date  . HERNIA REPAIR      Current Outpatient Medications  Medication Sig Dispense Refill  . amLODipine (NORVASC) 10 MG tablet Take 10 mg by mouth daily.    . baclofen (LIORESAL) 10 MG tablet 1 tablet 3 (three) times daily.    . butalbital-acetaminophen-caffeine (FIORICET, ESGIC) 50-325-40 MG tablet 1 tablet every 6 (six) hours as needed.    . fluticasone (FLONASE) 50 MCG/ACT nasal spray Place 2 sprays into both nostrils daily. 1 g 0  . hydrochlorothiazide (HYDRODIURIL) 25 MG tablet Take 25 mg by mouth daily.      Marland Kitchen. omeprazole (PRILOSEC) 20 MG capsule Take 1 capsule (20 mg total) by mouth daily. 15 capsule 0  . traZODone (DESYREL) 50 MG tablet TAKE 1 TABLET BY MOUTH EVERY DAY AT BEDTIME AS NEEDED  2  . sertraline (ZOLOFT) 50 MG tablet Take 50 mg by mouth daily.    Marland Kitchen. topiramate (TOPAMAX) 50 MG tablet Take 1 tablet (50 mg total) by mouth 2 (two) times daily. 60 tablet 11   No current facility-administered medications for this visit.     Allergies as of 01/16/2018  . (No Known Allergies)    Vitals: BP 126/81   Pulse 65   Ht 5\' 7"  (1.702 m)   Wt 198 lb (89.8 kg)   BMI 31.01 kg/m  Last Weight:  Wt Readings from Last 1 Encounters:  01/16/18 198 lb (89.8 kg)   Last Height:   Ht Readings from Last 1 Encounters:  01/16/18 5\' 7"  (1.702 m)   Physical exam: Exam: Gen: NAD, conversant, well nourised, well groomed                     CV: RRR, no MRG. No Carotid Bruits. No peripheral edema, warm, nontender Eyes: Conjunctivae clear without exudates or hemorrhage  Neuro: Detailed Neurologic Exam  Speech:    Speech is normal; fluent and spontaneous with normal comprehension.  Cognition:    The patient is oriented to person, place, and time;     recent and remote memory intact;     language fluent;     normal attention, concentration,     fund of knowledge Cranial Nerves:    The pupils are equal, round, and reactive to light. The fundi are normal and spontaneous venous pulsations are present. Visual fields are full to finger confrontation. Extraocular movements are intact. Trigeminal sensation is intact and the muscles of mastication are normal. The face is symmetric. The palate elevates in the midline. Hearing intact. Voice is normal. Shoulder shrug is normal. The tongue has normal motion without fasciculations.   Coordination:    Normal finger to nose and heel to shin. Normal rapid alternating movements.   Gait:    Heel-toe and tandem gait are normal.   Motor Observation:    No  asymmetry, no atrophy, and no involuntary movements noted. Tone:    Normal muscle tone.    Posture:    Posture is normal. normal erect    Strength:    Strength is V/V in the upper and lower limbs.      Sensation: intact to LT     Reflex Exam:  DTR's:    Deep tendon reflexes in  the upper and lower extremities are normal bilaterally.   Toes:    The toes are downgoing bilaterally.   Clonus:    Clonus is absent.       Assessment/Plan:  52 year old with new onset daily headaches with migrainous features. May be Migraines but needs a thorough review.   Lumbar puncture (called, scheduled Monday) Labs today  Increase Topiramate to 50mg  twice saily Stopped the zoloft, will see if this was contributing (started it 2 weeks prior to headache)   Orders Placed This Encounter  Procedures  . DG FLUORO GUIDED LOC OF NEEDLE/CATH TIP FOR SPINAL INJECT LT  . Sedimentation rate  . C-reactive protein    Meds ordered this encounter  Medications  . topiramate (TOPAMAX) 50 MG tablet    Sig: Take 1 tablet (50 mg total) by mouth 2 (two) times daily.    Dispense:  60 tablet    Refill:  11   Discussed: To prevent or relieve headaches, try the following: Cool Compress. Lie down and place a cool compress on your head.  Avoid headache triggers. If certain foods or odors seem to have triggered your migraines in the past, avoid them. A headache diary might help you identify triggers.  Include physical activity in your daily routine. Try a daily walk or other moderate aerobic exercise.  Manage stress. Find healthy ways to cope with the stressors, such as delegating tasks on your to-do list.  Practice relaxation techniques. Try deep breathing, yoga, massage and visualization.  Eat regularly. Eating regularly scheduled meals and maintaining a healthy diet might help prevent headaches. Also, drink plenty of fluids.  Follow a regular sleep schedule. Sleep deprivation might contribute to  headaches Consider biofeedback. With this mind-body technique, you learn to control certain bodily functions - such as muscle tension, heart rate and blood pressure - to prevent headaches or reduce headache pain.    Proceed to emergency room if you experience new or worsening symptoms or symptoms do not resolve, if you have new neurologic symptoms or if headache is severe, or for any concerning symptom.   Provided education and documentation from American headache Society toolbox including articles on: chronic migraine medication overuse headache, chronic migraines, prevention of migraines, behavioral and other nonpharmacologic treatments for headache.  Cc: [facility]  Naomie Dean, MD  [facility] [address] [place], [place] [address]  Phone [phone] Fax [phone]

## 2018-01-16 NOTE — Patient Instructions (Addendum)
Increase Topiramate Decrease over the counter analgesics and Fioricet Lumbar Puncture Labs today   Migraine Headache A migraine headache is an intense, throbbing pain on one side or both sides of the head. Migraines may also cause other symptoms, such as nausea, vomiting, and sensitivity to light and noise. What are the causes? Doing or taking certain things may also trigger migraines, such as:  Alcohol.  Smoking.  Medicines, such as: ? Medicine used to treat chest pain (nitroglycerine). ? Birth control pills. ? Estrogen pills. ? Certain blood pressure medicines.  Aged cheeses, chocolate, or caffeine.  Foods or drinks that contain nitrates, glutamate, aspartame, or tyramine.  Physical activity.  Other things that may trigger a migraine include:  Menstruation.  Pregnancy.  Hunger.  Stress, lack of sleep, too much sleep, or fatigue.  Weather changes.  What increases the risk? The following factors may make you more likely to experience migraine headaches:  Age. Risk increases with age.  Family history of migraine headaches.  Being Caucasian.  Depression and anxiety.  Obesity.  Being a woman.  Having a hole in the heart (patent foramen ovale) or other heart problems.  What are the signs or symptoms? The main symptom of this condition is pulsating or throbbing pain. Pain may:  Happen in any area of the head, such as on one side or both sides.  Interfere with daily activities.  Get worse with physical activity.  Get worse with exposure to bright lights or loud noises.  Other symptoms may include:  Nausea.  Vomiting.  Dizziness.  General sensitivity to bright lights, loud noises, or smells.  Before you get a migraine, you may get warning signs that a migraine is developing (aura). An aura may include:  Seeing flashing lights or having blind spots.  Seeing bright spots, halos, or zigzag lines.  Having tunnel vision or blurred  vision.  Having numbness or a tingling feeling.  Having trouble talking.  Having muscle weakness.  How is this diagnosed? A migraine headache can be diagnosed based on:  Your symptoms.  A physical exam.  Tests, such as CT scan or MRI of the head. These imaging tests can help rule out other causes of headaches.  Taking fluid from the spine (lumbar puncture) and analyzing it (cerebrospinal fluid analysis, or CSF analysis).  How is this treated? A migraine headache is usually treated with medicines that:  Relieve pain.  Relieve nausea.  Prevent migraines from coming back.  Treatment may also include:  Acupuncture.  Lifestyle changes like avoiding foods that trigger migraines.  Follow these instructions at home: Medicines  Take over-the-counter and prescription medicines only as told by your health care provider.  Do not drive or use heavy machinery while taking prescription pain medicine.  To prevent or treat constipation while you are taking prescription pain medicine, your health care provider may recommend that you: ? Drink enough fluid to keep your urine clear or pale yellow. ? Take over-the-counter or prescription medicines. ? Eat foods that are high in fiber, such as fresh fruits and vegetables, whole grains, and beans. ? Limit foods that are high in fat and processed sugars, such as fried and sweet foods. Lifestyle  Avoid alcohol use.  Do not use any products that contain nicotine or tobacco, such as cigarettes and e-cigarettes. If you need help quitting, ask your health care provider.  Get at least 8 hours of sleep every night.  Limit your stress. General instructions   Keep a journal to find out  what may trigger your migraine headaches. For example, write down: ? What you eat and drink. ? How much sleep you get. ? Any change to your diet or medicines.  If you have a migraine: ? Avoid things that make your symptoms worse, such as bright  lights. ? It may help to lie down in a dark, quiet room. ? Do not drive or use heavy machinery. ? Ask your health care provider what activities are safe for you while you are experiencing symptoms.  Keep all follow-up visits as told by your health care provider. This is important. Contact a health care provider if:  You develop symptoms that are different or more severe than your usual migraine symptoms. Get help right away if:  Your migraine becomes severe.  You have a fever.  You have a stiff neck.  You have vision loss.  Your muscles feel weak or like you cannot control them.  You start to lose your balance often.  You develop trouble walking.  You faint. This information is not intended to replace advice given to you by your health care provider. Make sure you discuss any questions you have with your health care provider. Document Released: 06/10/2005 Document Revised: 12/29/2015 Document Reviewed: 11/27/2015 Elsevier Interactive Patient Education  2017 [employer].

## 2018-01-17 LAB — SEDIMENTATION RATE: Sed Rate: 8 mm/hr (ref 0–30)

## 2018-01-17 LAB — C-REACTIVE PROTEIN: CRP: 1 mg/L (ref 0–10)

## 2018-01-18 ENCOUNTER — Encounter: Payer: Self-pay | Admitting: Neurology

## 2018-01-18 DIAGNOSIS — G8929 Other chronic pain: Secondary | ICD-10-CM | POA: Insufficient documentation

## 2018-01-18 DIAGNOSIS — G43001 Migraine without aura, not intractable, with status migrainosus: Secondary | ICD-10-CM | POA: Insufficient documentation

## 2018-01-18 DIAGNOSIS — R51 Headache: Secondary | ICD-10-CM

## 2018-01-28 ENCOUNTER — Ambulatory Visit
Admission: RE | Admit: 2018-01-28 | Discharge: 2018-01-28 | Disposition: A | Discharge status: Home / Self Care | Payer: 59 | Source: Ambulatory Visit | Attending: Neurology | Admitting: Neurology

## 2018-01-28 ENCOUNTER — Telehealth: Payer: Self-pay | Admitting: Neurology

## 2018-01-28 VITALS — BP 127/70 | HR 59

## 2018-01-28 DIAGNOSIS — R51 Headache: Principal | ICD-10-CM

## 2018-01-28 DIAGNOSIS — G8929 Other chronic pain: Secondary | ICD-10-CM

## 2018-01-28 NOTE — Discharge Instructions (Signed)

## 2018-01-28 NOTE — Telephone Encounter (Signed)
error 

## 2018-01-29 ENCOUNTER — Telehealth: Payer: Self-pay | Admitting: *Deleted

## 2018-01-29 NOTE — Telephone Encounter (Addendum)
Spoke with patient and informed him that his lumbar puncture was normal. Pt verbalized appreciation and his questions were answered. He asked when Dr. Lucia GaskinsAhern wants him to f/u in office. RN will send message to MD and call patient back. Pt stated he still feels a little pressure in his head and back is a little sore but pressure is better than prior and he is up and moving. Pt advised for any emergencies proceed to ED or if any questions or concerns arise please call us back. Pt verbalized appreciation and understanding.   ----- Message from Anson FretAntonia B Ahern, MD sent at 01/29/2018 12:23 PM EDT ----- Lumbar puncture normal thanks

## 2018-01-30 NOTE — Telephone Encounter (Signed)
Called the patient and spoke with him in regards to what Dr Lucia GaskinsAhern recommended. She states that the patient should be seen in 4 months and he can update us in 3-4 weeks if the medication is not being helpful.  The wife was with the patient and was concerned about waiting that long since there was no diagnosis on the pt. Informed her the diagnosis was migraines/headaches. The wife didn't understand how that could be the cause of why he is being put down due to the headaches when he had never had these before. Informed the patient and his wife that sometimes headaches and migraines can just start up. Could be related to genetics, weather or diet. Informed that so many things can cause these. Since increasing the patients topamax in July to 50 mg twice a day the patient states that his dizziness has decreased and the headaches are milder but he still has pressure feeling every day. He has not returned to work yet. He still having to take the baclofen. The wife states that he cant go to work when he is having to take the medication. Informed the pt and wife that I will let Dr Lucia GaskinsAhern know how his symptoms are currently and see if she feels a change is necessary or if he should give the medication more time to work. Pt verbalized understanding. I was able to schedule the patient for an apt in November.

## 2018-01-30 NOTE — Telephone Encounter (Signed)
4 months but in the meantime if his headaches arent better in 4 weeks he needs to email me so we can adjust his medications or start new medications. thanks

## 2018-02-02 NOTE — Telephone Encounter (Signed)
I have no other cause for his headaches and he responded extremely well to the migraine cocktail. I would like him to keep me updated and we may increase the Topiramate further in a few weeks based on how he is doing. [facility] ac[facility] thanks

## 2018-02-06 ENCOUNTER — Telehealth: Payer: Self-pay | Admitting: *Deleted

## 2018-02-06 NOTE — Telephone Encounter (Signed)
I called pt to let him know that we are unable to fill out his [employer] life form. Pt understood ask if I would fax last office to to his PCP.

## 2018-02-16 ENCOUNTER — Telehealth: Payer: Self-pay | Admitting: Neurology

## 2018-02-16 NOTE — Telephone Encounter (Addendum)
Spoke with Dr. Lucia GaskinsAhern. She was pleased to hear that pt's headaches are better but has agreed that we can further discuss his questions at office visit. May also potentially increase Topiramate. Called pt and discussed this. I offered him appt next Tues 02/24/18 @ 1:00 arrival time 12:30 or 2:00 arrival time 1:30. Pt wanted to take the 1:00 appt and reported that he may potentially have to cancel d/t another appt. RN advised that she would go ahead and schedule but asked for pt to please call back ASAP, will need to be at least 24 hours in advance to avoid a fee. He verbalized understanding and appreciation.

## 2018-02-16 NOTE — Telephone Encounter (Signed)
Spoke with patient. Informed him that so far his CSF labs are normal. Asked pt how his headaches were in case Topiramate needs to be increased. He stated that his headaches are still there and he is still a little dizzy. However he can't say that they are extreme like before. He stated that he had wanted to come into the office to discuss getting an EEG and f/u to the initial visit.

## 2018-02-16 NOTE — Telephone Encounter (Signed)
All his csf results have been thus far normal. How are his headaches? We wanted him to let us know how things were going so we could increase his Topiramate if needed, let me know thanks

## 2018-02-24 ENCOUNTER — Encounter: Payer: Self-pay | Admitting: Neurology

## 2018-02-24 ENCOUNTER — Ambulatory Visit (INDEPENDENT_AMBULATORY_CARE_PROVIDER_SITE_OTHER): Payer: No Typology Code available for payment source | Admitting: Neurology

## 2018-02-24 VITALS — BP 125/75 | HR 76 | Ht 67.0 in | Wt 189.0 lb

## 2018-02-24 DIAGNOSIS — G43001 Migraine without aura, not intractable, with status migrainosus: Secondary | ICD-10-CM | POA: Diagnosis not present

## 2018-02-24 DIAGNOSIS — Z79899 Other long term (current) drug therapy: Secondary | ICD-10-CM

## 2018-02-24 DIAGNOSIS — R51 Headache: Secondary | ICD-10-CM | POA: Diagnosis not present

## 2018-02-24 DIAGNOSIS — H9202 Otalgia, left ear: Secondary | ICD-10-CM | POA: Diagnosis not present

## 2018-02-24 DIAGNOSIS — G8929 Other chronic pain: Secondary | ICD-10-CM

## 2018-02-24 MED ORDER — TOPIRAMATE 100 MG PO TABS: 200.0000 mg | ORAL_TABLET | Freq: Every day | ORAL | 11 refills | Status: DC

## 2018-02-24 NOTE — Progress Notes (Signed)
[facility]    Provider:  Dr Jaynee Eagles Referring Provider: [facility] Primary Care Physician:  [facility]  CC:  Headache  Interval history 02/24/2018: He reports improvement with increase in Topiramate. He has started having ear pain in the left ear and since then a little worse.  He has a stinging pain on the left side of the head and travels down his jae on the left. He has not seen his pcp to examine his ear.  He is trying to see an ENT bit the [place] hasn't set this up. The ear pain is new. It hurst when he swallow on the left.  H still wakes up with headaches, he is seeing his sleep doctor on the 13th to ensure that cpap machine is setup correctly and heck a report.  Headaches improved, he appears comfortable today and he can go 3 days without significant pain.   Interval history 02/24/2018: Imaging of the brain and LP opening pressure and csf studies were normal. He is here for follow up of severe headaches. At last appointment he significantly approved with a migraine cocktail.  HPI:  William Mccall is a 52 y.o. male here as a referral for headache.  He is here with his wife. PMHx HTN, OSA on cpap managed by the [facility] but reports mild sleep apnea. Headache started memorial day weekend. He stared having a throbbing headache, left side of the head, severe painful throbbing. He has daily headache, dizziness, nausea, "loopy daily". He feels his peripheral vision is "gone", his vision is blurred, he sees double vision at times offset. If he is laying down in bed he has to close one eye. Hi balance is "ooff" he can just lose his balance. No inciting events except he started taking Zoloft. He stopped the zoloft 2 days ago. He has light and sound sensitivity, nausea with the headaches. He has to ask people to dim the lights which helps. He wakes with headaches,  No FHx of migraines. Movement makes it worse. No aura. No numbness or tingling. He also has pressure behind his eyes. He has  episodes of double vision.  He has had similar headaches in the past, he had the worst headache on memorial day, daily headaches and pressure in his head. He has a 6/10 headache today, appears very uncomfortable. Smokes marijuana, + hx of alcohol use now only social.    Reviewed notes, labs and imaging from outside physicians, which showed:  MRI brain normal, personally reviewed imaging 12/24/2017  CTA head/neck: reviewed reports: Negative exams  CMP elevated ALT 47 otherwise normal, rpr/hiv negative  Review of Systems: Patient complains of symptoms per HPI as well as the following symptoms: light sensitivity, blurred vision, dizziness, headache, depression, hyperactive, neck pain and stiffness. Pertinent negatives and positives per HPI. All others negative.   Social History   Socioeconomic History  . Marital status: Married    Spouse name: Not on file  . Number of children: 3  . Years of education: Not on file  . Highest education level: Some college, no degree  Occupational History  . Not on file  Social Needs  . Financial resource strain: Not on file  . Food insecurity:    Worry: Not on file    Inability: Not on file  . Transportation needs:    Medical: Not on file    Non-medical: Not on file  Tobacco Use  . Smoking status: Never Smoker  . Smokeless tobacco: Former Systems developer  Types: Chew  Substance and Sexual Activity  . Alcohol use: Not Currently    Comment: quit July 2019  . Drug use: Not Currently    Types: Marijuana    Comment: quit July 2019  . Sexual activity: Yes  Lifestyle  . Physical activity:    Days per week: Not on file    Minutes per session: Not on file  . Stress: Not on file  Relationships  . Social connections:    Talks on phone: Not on file    Gets together: Not on file    Attends religious service: Not on file    Active member of club or organization: Not on file    Attends meetings of clubs or organizations: Not on file    Relationship status:  Not on file  . Intimate partner violence:    Fear of current or ex partner: Not on file    Emotionally abused: Not on file    Physically abused: Not on file    Forced sexual activity: Not on file  Other Topics Concern  . Not on file  Social History Narrative   Lives at home with his wife   Right handed   Caffeine: some    Family History  Problem Relation Age of Onset  . Migraines Neg Hx     Past Medical History:  Diagnosis Date  . Hypertension     Past Surgical History:  Procedure Laterality Date  . HERNIA REPAIR      Current Outpatient Medications  Medication Sig Dispense Refill  . amLODipine (NORVASC) 10 MG tablet Take 10 mg by mouth daily.    . baclofen (LIORESAL) 10 MG tablet 1 tablet 3 (three) times daily.    . hydrochlorothiazide (HYDRODIURIL) 25 MG tablet Take 25 mg by mouth daily.    . traZODone (DESYREL) 50 MG tablet TAKE 1 TABLET BY MOUTH EVERY DAY AT BEDTIME AS NEEDED  2  . butalbital-acetaminophen-caffeine (FIORICET, ESGIC) 50-325-40 MG tablet 1 tablet every 6 (six) hours as needed.    . fluticasone (FLONASE) 50 MCG/ACT nasal spray Place 2 sprays into both nostrils daily. (Patient not taking: Reported on 02/24/2018) 1 g 0  . sertraline (ZOLOFT) 50 MG tablet Take 50 mg by mouth daily.    Marland Kitchen topiramate (TOPAMAX) 100 MG tablet Take 2 tablets (200 mg total) by mouth at bedtime. 60 tablet 11   No current facility-administered medications for this visit.     Allergies as of 02/24/2018  . (No Known Allergies)    Vitals: BP 125/75 (BP Location: Right Arm, Patient Position: Sitting)   Pulse 76   Ht '5\' 7"'$  (1.702 m)   Wt 189 lb (85.7 kg)   BMI 29.60 kg/m  Last Weight:  Wt Readings from Last 1 Encounters:  02/24/18 189 lb (85.7 kg)   Last Height:   Ht Readings from Last 1 Encounters:  02/24/18 '5\' 7"'$  (1.702 m)   Physical exam: Exam: Gen: NAD, conversant, well nourised, well groomed                     CV: RRR, no MRG. No Carotid Bruits. No peripheral  edema, warm, nontender Eyes: Conjunctivae clear without exudates or hemorrhage  Neuro: Detailed Neurologic Exam  Speech:    Speech is normal; fluent and spontaneous with normal comprehension.  Cognition:    The patient is oriented to person, place, and time;     recent and remote memory intact;     language fluent;  normal attention, concentration,     fund of knowledge Cranial Nerves:    The pupils are equal, round, and reactive to light. The fundi are normal and spontaneous venous pulsations are present. Visual fields are full to finger confrontation. Extraocular movements are intact. Trigeminal sensation is intact and the muscles of mastication are normal. The face is symmetric. The palate elevates in the midline. Hearing intact. Voice is normal. Shoulder shrug is normal. The tongue has normal motion without fasciculations.   Coordination:    Normal finger to nose and heel to shin. Normal rapid alternating movements.   Gait:    Heel-toe and tandem gait are normal.   Motor Observation:    No asymmetry, no atrophy, and no involuntary movements noted. Tone:    Normal muscle tone.    Posture:    Posture is normal. normal erect    Strength:    Strength is V/V in the upper and lower limbs.      Sensation: intact to LT     Reflex Exam:  DTR's:    Deep tendon reflexes in the upper and lower extremities are normal bilaterally.   Toes:    The toes are downgoing bilaterally.   Clonus:    Clonus is absent.       Assessment/Plan:  52 year old with new onset daily headaches with migrainous features. Evaluation unremarkable likely migraines and today may have left ear impaction and pain.  - Seeing his sleep doctor this month: ensure cpap is set properly - New left ear pain an dpain with swallowing (new symptoms from 3 weeks ago): ENT referral, exam showed tenderness and could not visualize the tympanic membrane due to obstruction possible wax. Dr. Ernesto Rutherford. - increase  Topiramate for migraibes - MRI of the brain was normal -Lumbar puncture opening pressure and csf studies were normal -Labs were all normal including esr/crp - check labs today  Orders Placed This Encounter  Procedures  . Topiramate level  . Basic Metabolic Panel  . Ambulatory referral to ENT     Discussed: To prevent or relieve headaches, try the following: Cool Compress. Lie down and place a cool compress on your head.  Avoid headache triggers. If certain foods or odors seem to have triggered your migraines in the past, avoid them. A headache diary might help you identify triggers.  Include physical activity in your daily routine. Try a daily walk or other moderate aerobic exercise.  Manage stress. Find healthy ways to cope with the stressors, such as delegating tasks on your to-do list.  Practice relaxation techniques. Try deep breathing, yoga, massage and visualization.  Eat regularly. Eating regularly scheduled meals and maintaining a healthy diet might help prevent headaches. Also, drink plenty of fluids.  Follow a regular sleep schedule. Sleep deprivation might contribute to headaches Consider biofeedback. With this mind-body technique, you learn to control certain bodily functions - such as muscle tension, heart rate and blood pressure - to prevent headaches or reduce headache pain.    Proceed to emergency room if you experience new or worsening symptoms or symptoms do not resolve, if you have new neurologic symptoms or if headache is severe, or for any concerning symptom.   Provided education and documentation from American headache Society toolbox including articles on: chronic migraine medication overuse headache, chronic migraines, prevention of migraines, behavioral and other nonpharmacologic treatments for headache.  Cc: [place], MD  [facility] [address] [place],  [address]  Phone  [phone] Fax  [phone]  A total of 25 minutes was spent face-to-face with this patient. Over half this time was spent on counseling patient on the  1. Migraine without aura and with status migrainosus, not intractable   2. Chronic intractable headache, unspecified headache type   3. Left ear pain   4. Long term use of drug     diagnosis and different diagnostic and therapeutic options, counseling and coordination of care, risks ans benefits of management, compliance, or risk factor reduction and education.

## 2018-02-24 NOTE — Patient Instructions (Addendum)
Increase Topiramate to 150mg  at bedtime and in one week increase to 200mg  at bedtime Dr. Haroldine Laws ENT for left ear pain and posisble impaction of ear wax - they will call you in the morning   Earwax Buildup, Adult The ears produce a substance called earwax that helps keep bacteria out of the ear and protects the skin in the ear canal. Occasionally, earwax can build up in the ear and cause discomfort or hearing loss. What increases the risk? This condition is more likely to develop in people who:  Are male.  Are elderly.  Naturally produce more earwax.  Clean their ears often with cotton swabs.  Use earplugs often.  Use in-ear headphones often.  Wear hearing aids.  Have narrow ear canals.  Have earwax that is overly thick or sticky.  Have eczema.  Are dehydrated.  Have excess hair in the ear canal.  What are the signs or symptoms? Symptoms of this condition include:  Reduced or muffled hearing.  A feeling of fullness in the ear or feeling that the ear is plugged.  Fluid coming from the ear.  Ear pain.  Ear itch.  Ringing in the ear.  Coughing.  An obvious piece of earwax that can be seen inside the ear canal.  How is this diagnosed? This condition may be diagnosed based on:  Your symptoms.  Your medical history.  An ear exam. During the exam, your health care provider will look into your ear with an instrument called an otoscope.  You may have tests, including a hearing test. How is this treated? This condition may be treated by:  Using ear drops to soften the earwax.  Having the earwax removed by a health care provider. The health care provider may: ? Flush the ear with water. ? Use an instrument that has a loop on the end (curette). ? Use a suction device.  Surgery to remove the wax buildup. This may be done in severe cases.  Follow these instructions at home:  Take over-the-counter and prescription medicines only as told by your health  care provider.  Do not put any objects, including cotton swabs, into your ear. You can clean the opening of your ear canal with a washcloth or facial tissue.  Follow instructions from your health care provider about cleaning your ears. Do not over-clean your ears.  Drink enough fluid to keep your urine clear or pale yellow. This will help to thin the earwax.  Keep all follow-up visits as told by your health care provider. If earwax builds up in your ears often or if you use hearing aids, consider seeing your health care provider for routine, preventive ear cleanings. Ask your health care provider how often you should schedule your cleanings.  If you have hearing aids, clean them according to instructions from the manufacturer and your health care provider. Contact a health care provider if:  You have ear pain.  You develop a fever.  You have blood, pus, or other fluid coming from your ear.  You have hearing loss.  You have ringing in your ears that does not go away.  Your symptoms do not improve with treatment.  You feel like the room is spinning (vertigo). Summary  Earwax can build up in the ear and cause discomfort or hearing loss.  The most common symptoms of this condition include reduced or muffled hearing and a feeling of fullness in the ear or feeling that the ear is plugged.  This condition may be diagnosed  based on your symptoms, your medical history, and an ear exam.  This condition may be treated by using ear drops to soften the earwax or by having the earwax removed by a health care provider.  Do not put any objects, including cotton swabs, into your ear. You can clean the opening of your ear canal with a washcloth or facial tissue. This information is not intended to replace advice given to you by your health care provider. Make sure you discuss any questions you have with your health care provider. Document Released: 07/18/2004 Document Revised: 08/21/2016 Document  Reviewed: 08/21/2016 Elsevier Interactive Patient Education  [employer].

## 2018-02-25 ENCOUNTER — Telehealth: Payer: Self-pay | Admitting: *Deleted

## 2018-02-25 LAB — BASIC METABOLIC PANEL
BUN / CREAT RATIO: 12 (ref 9–20)
BUN: 14 mg/dL (ref 6–24)
CHLORIDE: 102 mmol/L (ref 96–106)
CO2: 25 mmol/L (ref 20–29)
Calcium: 9.9 mg/dL (ref 8.7–10.2)
Creatinine, Ser: 1.13 mg/dL (ref 0.76–1.27)
GFR calc Af Amer: 87 mL/min/{1.73_m2} (ref >59)
GFR calc non Af Amer: 75 mL/min/{1.73_m2} (ref >59)
GLUCOSE: 83 mg/dL (ref 65–99)
Potassium: 3.1 mmol/L — ABNORMAL LOW (ref 3.5–5.2)
SODIUM: 144 mmol/L (ref 134–144)

## 2018-02-25 LAB — TOPIRAMATE LEVEL: Topiramate Lvl: 3.4 ug/mL (ref 2.0–25.0)

## 2018-02-25 NOTE — Telephone Encounter (Signed)
Called and spoke with patient about lab results per Dr. Lucia Gaskins. He verbalized understanding.   He requested phone number to Dr. Haroldine Laws. I provided: [phone]. He will call to try and get sooner appt.

## 2018-02-25 NOTE — Telephone Encounter (Signed)
-----   Message from Anson Fret, MD sent at 02/25/2018  1:38 PM EDT ----- Labs look fine, potassium is a little low which may be due to his HCTZ he should follow that with his primary care thanks

## 2018-02-26 LAB — CSF CELL COUNT WITH DIFFERENTIAL
RBC COUNT CSF: 3 {cells}/uL (ref 0–10)
WBC, CSF: 2 cells/uL (ref 0–5)

## 2018-02-26 LAB — CSF CULTURE W GRAM STAIN
Result:: NO GROWTH
SPECIMEN QUALITY:: ADEQUATE

## 2018-02-26 LAB — FUNGUS CULTURE W SMEAR
MICRO NUMBER:: 90934372
SMEAR: NONE SEEN
SPECIMEN QUALITY: ADEQUATE

## 2018-02-26 LAB — CSF CULTURE: MICRO NUMBER: 90934373

## 2018-02-26 LAB — GLUCOSE, CSF: Glucose, CSF: 65 mg/dL (ref 40–80)

## 2018-02-26 LAB — PROTEIN, CSF: Total Protein, CSF: 41 mg/dL (ref 15–45)

## 2018-02-26 LAB — VDRL, CSF: VDRL Quant, CSF: NONREACTIVE

## 2018-05-05 ENCOUNTER — Ambulatory Visit (INDEPENDENT_AMBULATORY_CARE_PROVIDER_SITE_OTHER): Payer: 59 | Admitting: Neurology

## 2018-05-05 ENCOUNTER — Encounter: Payer: Self-pay | Admitting: Neurology

## 2018-05-05 VITALS — BP 130/81 | HR 74 | Ht 67.0 in | Wt 194.0 lb

## 2018-05-05 DIAGNOSIS — G43709 Chronic migraine without aura, not intractable, without status migrainosus: Secondary | ICD-10-CM

## 2018-05-05 MED ORDER — ERENUMAB-AOOE 140 MG/ML ~~LOC~~ SOAJ: 140.0000 mg | SUBCUTANEOUS | 11 refills | Status: DC

## 2018-05-05 NOTE — Progress Notes (Signed)
[facility]    Provider:  Dr Jaynee Eagles Referring Provider: [facility] Primary Care Physician:  [facility]  CC:  Headache  Interval history 05/05/2018: He is feeling much better. He feels better. He still has headaches and has to have his glasses checked. He still has daily headache. More behind the eyes. Still on the topiramate 130m a day. Also on ca-channel blocker and ssri. These are all medications for headaches/migraines. Using cpap. Start aimovig.  Interval history 02/24/2018: He reports improvement with increase in Topiramate. He has started having ear pain in the left ear and since then a little worse.  He has a stinging pain on the left side of the head and travels down his jae on the left. He has not seen his pcp to examine his ear.  He is trying to see an ENT bit the [place]. The ear pain is new. It hurst when he swallow on the left.  H still wakes up with headaches, he is seeing his sleep doctor on the 13th to ensure that cpap machine is setup correctly and heck a report.  Headaches improved, he appears comfortable today and he can go 3 days without significant pain.   Interval history 02/24/2018: Imaging of the brain and LP opening pressure and csf studies were normal. He is here for follow up of severe headaches. At last appointment he significantly approved with a migraine cocktail.  HPI:  William BRUSOis a 52y.o. male here as a referral for headache.  He is here with his wife. PMHx HTN, [facility]. Headache started memorial day weekend. He stared having a throbbing headache, left side of the head, severe painful throbbing. He has daily headache, dizziness, nausea, "loopy daily". He feels his peripheral vision is "gone", his vision is blurred, he sees double vision at times offset. If he is laying down in bed he has to close one eye. Hi balance is "ooff" he can just lose his balance. No inciting  events except he started taking Zoloft. He stopped the zoloft 2 days ago. He has light and sound sensitivity, nausea with the headaches. He has to ask people to dim the lights which helps. He wakes with headaches,  No FHx of migraines. Movement makes it worse. No aura. No numbness or tingling. He also has pressure behind his eyes. He has episodes of double vision.  He has had similar headaches in the past, he had the worst headache on memorial day, daily headaches and pressure in his head. He has a 6/10 headache today, appears very uncomfortable. Smokes marijuana, + hx of alcohol use now only social.    Reviewed notes, labs and imaging from outside physicians, which showed:  MRI brain normal, personally reviewed imaging 12/24/2017  CTA head/neck: reviewed reports: Negative exams  CMP elevated ALT 47 otherwise normal, rpr/hiv negative  Review of Systems: Patient complains of symptoms per HPI as well as the following symptoms: light sensitivity, blurred vision, dizziness, headache, depression, hyperactive, neck pain and stiffness. Pertinent negatives and positives per HPI. All others negative.   Social History   Socioeconomic History  . Marital status: Married    Spouse name: Not on file  . Number of children: 3  . Years of education: Not on file  . Highest education level: Some college, no degree  Occupational History  . Not on file  Social Needs  . Financial resource strain: Not on  file  . Food insecurity:    Worry: Not on file    Inability: Not on file  . Transportation needs:    Medical: Not on file    Non-medical: Not on file  Tobacco Use  . Smoking status: Never Smoker  . Smokeless tobacco: Former Systems developer    Types: Chew  Substance and Sexual Activity  . Alcohol use: Not Currently    Comment: quit July 2019  . Drug use: Not Currently    Types: Marijuana    Comment: quit July 2019  . Sexual activity: Yes  Lifestyle  . Physical activity:    Days per week: Not on file     Minutes per session: Not on file  . Stress: Not on file  Relationships  . Social connections:    Talks on phone: Not on file    Gets together: Not on file    Attends religious service: Not on file    Active member of club or organization: Not on file    Attends meetings of clubs or organizations: Not on file    Relationship status: Not on file  . Intimate partner violence:    Fear of current or ex partner: Not on file    Emotionally abused: Not on file    Physically abused: Not on file    Forced sexual activity: Not on file  Other Topics Concern  . Not on file  Social History Narrative   Lives at home with his wife   Right handed   Caffeine: 1 cup green tea daily, no longer drinking coffee    Family History  Problem Relation Age of Onset  . Migraines Neg Hx     Past Medical History:  Diagnosis Date  . Hypertension     Past Surgical History:  Procedure Laterality Date  . HERNIA REPAIR      Current Outpatient Medications  Medication Sig Dispense Refill  . amLODipine (NORVASC) 10 MG tablet Take 10 mg by mouth daily.    . baclofen (LIORESAL) 10 MG tablet 1 tablet 3 (three) times daily.    . hydrochlorothiazide (HYDRODIURIL) 25 MG tablet Take 25 mg by mouth daily.    . sertraline (ZOLOFT) 50 MG tablet Take 50 mg by mouth daily.    Marland Kitchen topiramate (TOPAMAX) 100 MG tablet Take 2 tablets (200 mg total) by mouth at bedtime. (Patient taking differently: Take 100 mg by mouth at bedtime. ) 60 tablet 11  . traZODone (DESYREL) 50 MG tablet TAKE 1 TABLET BY MOUTH EVERY DAY AT BEDTIME AS NEEDED  2  . butalbital-acetaminophen-caffeine (FIORICET, ESGIC) 50-325-40 MG tablet 1 tablet every 6 (six) hours as needed.    Eduard Roux (AIMOVIG) 140 MG/ML SOAJ Inject 140 mg into the skin every 30 (thirty) days. 1 pen 11   No current facility-administered medications for this visit.     Allergies as of 05/05/2018  . (No Known Allergies)    Vitals: BP 130/81 (BP Location: Right Arm,  Patient Position: Sitting)   Pulse 74   Ht 5' 7" (1.702 m)   Wt 194 lb (88 kg)   BMI 30.38 kg/m  Last Weight:  Wt Readings from Last 1 Encounters:  05/05/18 194 lb (88 kg)   Last Height:   Ht Readings from Last 1 Encounters:  05/05/18 5' 7" (1.702 m)   Physical exam: Exam: Gen: NAD, conversant, well nourised, well groomed  CV: RRR, no MRG. No Carotid Bruits. No peripheral edema, warm, nontender Eyes: Conjunctivae clear without exudates or hemorrhage  Neuro: Detailed Neurologic Exam  Speech:    Speech is normal; fluent and spontaneous with normal comprehension.  Cognition:    The patient is oriented to person, place, and time;     recent and remote memory intact;     language fluent;     normal attention, concentration,     fund of knowledge Cranial Nerves:    The pupils are equal, round, and reactive to light. The fundi are normal and spontaneous venous pulsations are present. Visual fields are full to finger confrontation. Extraocular movements are intact. Trigeminal sensation is intact and the muscles of mastication are normal. The face is symmetric. The palate elevates in the midline. Hearing intact. Voice is normal. Shoulder shrug is normal. The tongue has normal motion without fasciculations.   Coordination:    Normal finger to nose and heel to shin. Normal rapid alternating movements.   Gait:    Heel-toe and tandem gait are normal.   Motor Observation:    No asymmetry, no atrophy, and no involuntary movements noted. Tone:    Normal muscle tone.    Posture:    Posture is normal. normal erect    Strength:    Strength is V/V in the upper and lower limbs.      Sensation: intact to LT     Reflex Exam:  DTR's:    Deep tendon reflexes in the upper and lower extremities are normal bilaterally.   Toes:    The toes are downgoing bilaterally.   Clonus:    Clonus is absent.       Assessment/Plan:  52 year old with new onset daily  headaches with migrainous features. Migraines improved but still with daily headaches.   - Saw his sleep doctor this month: ensure cpap is set properly, he is using it every night - New left ear pain an dpain with swallowing (new symptoms from 3 weeks ago): ENT referral, exam showed tenderness and could not visualize the tympanic membrane due to obstruction possible wax. Dr. Ernesto Rutherford: improved, treated with antibiotics - continue Topiramate for migraines - MRI of the brain was normal -Lumbar puncture opening pressure and csf studies were normal -Labs were all normal including esr/crp - start aimovig, he is already on 3 migraine medications still chronic migraines   Meds ordered this encounter  Medications  . Erenumab-aooe (AIMOVIG) 140 MG/ML SOAJ    Sig: Inject 140 mg into the skin every 30 (thirty) days.    Dispense:  1 pen    Refill:  11     Discussed: To prevent or relieve headaches, try the following: Cool Compress. Lie down and place a cool compress on your head.  Avoid headache triggers. If certain foods or odors seem to have triggered your migraines in the past, avoid them. A headache diary might help you identify triggers.  Include physical activity in your daily routine. Try a daily walk or other moderate aerobic exercise.  Manage stress. Find healthy ways to cope with the stressors, such as delegating tasks on your to-do list.  Practice relaxation techniques. Try deep breathing, yoga, massage and visualization.  Eat regularly. Eating regularly scheduled meals and maintaining a healthy diet might help prevent headaches. Also, drink plenty of fluids.  Follow a regular sleep schedule. Sleep deprivation might contribute to headaches Consider biofeedback. With this mind-body technique, you learn to control certain bodily functions -  such as muscle tension, heart rate and blood pressure - to prevent headaches or reduce headache pain.    Proceed to emergency room if you experience  new or worsening symptoms or symptoms do not resolve, if you have new neurologic symptoms or if headache is severe, or for any concerning symptom.   Provided education and documentation from American headache Society toolbox including articles on: chronic migraine medication overuse headache, chronic migraines, prevention of migraines, behavioral and other nonpharmacologic treatments for headache.  Cc: [place], MD  [facility] [address] [place], [place] [address]  Phone [phone] Fax [phone]  A total of 25 minutes was spent face-to-face with this patient. Over half this time was spent on counseling patient on the  1. Chronic migraine without aura without status migrainosus, not intractable     diagnosis and different diagnostic and therapeutic options, counseling and coordination of care, risks ans benefits of management, compliance, or risk factor reduction and education.

## 2018-05-05 NOTE — Patient Instructions (Signed)
Erenumab: Patient drug information [employer] here. Copyright [id number] [employer]. All rights reserved. (For additional information see "Erenumab: Drug information") Brand Names: [place]  Aimovig;  Aimovig (140 MG Dose) [DSC]  Brand Names: [place]  Aimovig  What is this drug used for?   It is used to prevent migraine headaches.  What do I need to tell my doctor BEFORE I take this drug?   If you have an allergy to this drug or any part of this drug.   If you are allergic to any drugs like this one, any other drugs, foods, or other substances. Tell your doctor about the allergy and what signs you had, like rash; hives; itching; shortness of breath; wheezing; cough; swelling of face, lips, tongue, or throat; or any other signs.   This drug may interact with other drugs or health problems.   Tell your doctor and pharmacist about all of your drugs (prescription or OTC, natural products, vitamins) and health problems. You must check to make sure that it is safe for you to take this drug with all of your drugs and health problems. Do not start, stop, or change the dose of any drug without checking with your doctor.  What are some things I need to know or do while I take this drug?   Tell all of your health care providers that you take this drug. This includes your doctors, nurses, pharmacists, and dentists.   If you have a latex allergy, talk with your doctor.   Tell your doctor if you are pregnant, plan on getting pregnant, or are breast-feeding. You will need to talk about the benefits and risks to you and the baby.  What are some side effects that I need to call my doctor about right away?   WARNING/CAUTION: Even though it may be rare, some people may have very bad and sometimes deadly side effects when taking a drug. Tell your doctor or get medical help right away if you have any of the following signs or symptoms that may be related to a very bad side effect:   Signs of an  allergic reaction, like rash; hives; itching; red, swollen, blistered, or peeling skin with or without fever; wheezing; tightness in the chest or throat; trouble breathing, swallowing, or talking; unusual hoarseness; or swelling of the mouth, face, lips, tongue, or throat.  What are some other side effects of this drug?   All drugs may cause side effects. However, many people have no side effects or only have minor side effects. Call your doctor or get medical help if any of these side effects or any other side effects bother you or do not go away:   Pain, redness, or swelling where the injection was given.   Constipation is common with this drug. In some people, severe constipation led to treatment in a hospital or surgery. Call your doctor right away if you have constipation that is severe or does not go away.   These are not all of the side effects that may occur. If you have questions about side effects, call your doctor. Call your doctor for medical advice about side effects.   You may report side effects to your national health agency.  How is this drug best taken?   Use this drug as ordered by your doctor. Read all information given to you. Follow all instructions closely.   It is given as a shot into the fatty part of the skin on the top of the  thigh, belly area, or upper arm.   If you will be giving yourself the shot, your doctor or nurse will teach you how to give the shot.   If stored in a refrigerator, let this drug come to room temperature before using it. Leave it at room temperature for at least 30 minutes. Do not heat this drug.   Protect from heat and sunlight.   Do not shake.   Do not give into skin within 2 inches of the belly button.   Do not give into skin that is irritated, tender, bruised, red, scaly, hard, scarred, or has stretch marks.   Do not use if the solution is cloudy, leaking, or has particles.   This drug is colorless to a faint yellow. Do not use if the solution  changes color.   Throw away after using. Do not use the device more than 1 time.   Throw away needles in a needle/sharp disposal box. Do not reuse needles or other items. When the box is full, follow all local rules for getting rid of it. Talk with a doctor or pharmacist if you have any questions.  What do I do if I miss a dose?   Take a missed dose as soon as you think about it.   After taking a missed dose, start a new schedule based on when the dose is taken.  How do I store and/or throw out this drug?   Store in a refrigerator. Do not freeze.   Store in the original container to protect from light.   Do not use if it has been frozen.   If you drop this drug on a hard surface, do not use it.   If needed, you may store at room temperature for up to 7 days. Write down the date you take this drug out of the refrigerator. If stored at room temperature and not used within 7 days, throw this drug away.   Do not put this drug back in the refrigerator after it has been stored at room temperature.   Keep all drugs in a safe place. Keep all drugs out of the reach of children and pets.   Throw away unused or expired drugs. Do not flush down a toilet or pour down a drain unless you are told to do so. Check with your pharmacist if you have questions about the best way to throw out drugs. There may be drug take-back programs in your area.  General drug facts   If your symptoms or health problems do not get better or if they become worse, call your doctor.   Do not share your drugs with others and do not take anyone else's drugs.   Keep a list of all your drugs (prescription, natural products, vitamins, OTC) with you. Give this list to your doctor.   Talk with the doctor before starting any new drug, including prescription or OTC, natural products, or vitamins.   Some drugs may have another patient information leaflet. If you have any questions about this drug, please talk with your doctor, nurse,  pharmacist, or other health care provider.   If you think there has been an overdose, call your poison control center or get medical care right away. Be ready to tell or show what was taken, how much, and when it happened.  Use of UpToDate is subject to the Subscription and License Agreement. Topic [id number] Version 9.0

## 2018-05-11 ENCOUNTER — Telehealth: Payer: Self-pay | Admitting: *Deleted

## 2018-05-11 NOTE — Telephone Encounter (Signed)
Faxed supporting office notes and information per request from [facility] for pt's Aimovig prescription as part of PA process. Received a receipt of confirmation.

## 2018-05-29 ENCOUNTER — Telehealth: Payer: Self-pay | Admitting: Neurology

## 2018-05-29 NOTE — Telephone Encounter (Signed)
Pt requesting a call stating he has not started medication Erenumab-aooe (AIMOVIG) 140 MG/ML SOAJ would like to discuss with RN before starting.

## 2018-06-01 NOTE — Telephone Encounter (Signed)
Returned pt's call and LVM (ok per DPR) asking for call back to discuss his Aimovig questions. Left office number and hours in message.

## 2018-06-02 NOTE — Telephone Encounter (Signed)
Called pt & discussed his questions which were answered. Reviewed instructions for the Aimovig and informed pt that his medication will come with detailed instructions as well. Discussed care of Aimovig before injection (refrigeration) and to let it sit at room temperature 30 minutes prior to injection. Do not place in direct sunlight, freeze or place in microwave). He stated that last week he saw his new PCP [facility] and he had a bad headache. His wife had to take him home and he was unable to fill out the paperwork there. He was given a Toradol shot. He has the Aimovig now and plans to start it tonight. Per pt, plan will be to reevaluate with Dr. Lucia GaskinsAhern in 3 months to see about decreasing other medications. He verbalized appreciation for the call.

## 2018-06-29 ENCOUNTER — Telehealth: Payer: Self-pay | Admitting: Neurology

## 2018-06-29 NOTE — Telephone Encounter (Addendum)
Dr. Lucia Gaskins- please advise I called wife back. Relayed Dr. Bonnita Hollow recommendations. Wife expressed dissatisfaction. I recommended if sx severe, can bring him to urgent care to be evaluated. Wife declined stating they won't do anything for him. Wanting to know what other options are, if they should bring him to [facility] for evaluation. Advised that is their decision and if they would like they can proceed bringing him there for second opinion. Wife states he has missed 1-2 days of work and will miss tomorrow. Wants a note from Dr. Lucia Gaskins. Advised this would be up to Dr. Lucia Gaskins to provide. Advised Dr. Lucia Gaskins out today but will be back tomorrow. I will send her a message for further suggestions. She verbalized understanding.  He has appt with ENT at [facility] end of January.

## 2018-06-29 NOTE — Telephone Encounter (Signed)
He should continue on the Aimovig.  It may take a couple months for her to fully kick in.

## 2018-06-29 NOTE — Telephone Encounter (Signed)
Pt wife(on DPR-Joy Duyck) has called stating pt has had to leave work early due to dizzy spells, sever headache, nausea .  Wife states she is on the way to take pt to [facility] because he is not due until 01-10 to take next Aimovig, she wants to know what is next.  Please call

## 2018-06-30 NOTE — Telephone Encounter (Signed)
I have spoken with Mrs. Ohanian and per Dr. Lucia Gaskins, explained that at last ov, pt. was feeling better. Until now, we have not received any communication from pt. or wife that pt. is feeling worse.  Dr. Trevor Mace recommendation is that pt. continue Aimovig for at least another month. Wife sts. she is not interested in the treatment as much as finding out exactly what is causing his sx (h/a, dizziness). I have explained that sometimes we do not find an exact cause. Sometimes the best we can do is r/o possible casues, such as stroke, try to identify triggers, and treat the sx. Wife sts. she is not expressing dissatisfaction with our office, but more "love for my husband."  She sts. she is happy with our office b/c "you took care of him when he was in pain." Wife asked again if she should take pt. to [facility] "so they can do research to find out an exact cause."   I explained that I am not sure what research studies are available that pt. may qualify for. He has had an LP, MRI brain, CT angio head and neck. If pt. and wife would like to seek a 2nd opinion, this is fine and recommended.  Dr. Lucia Gaskins is also agreeable to continuing care in our office if pt. wishes.  If pt. would like a 2nd opinion, Dr. Lucia Gaskins recommends the [facility] in [place]. Wife verbalized understanding of same/fim

## 2018-06-30 NOTE — Telephone Encounter (Signed)
At last appointment patient reported he was feeling much(much) better and that is the last I'v heard from him so I was not aware. Wife has expressed her dissatisfaction multiple times. But again, patient was doing well when I last saw him. I think at this point possibly the patient-doctor trust is impaired given wife's concerns and I would recommend seeing another neurologist and I recommend the [facility] in [place]. I don;t believe she needs a referral she can just call for appointment there. He is on multiple migraine medications at this point I agree with Dr. Epimenio Foot that the Aimovig takes time to work but sounds like he may have better care and a better trusting experience with another headache specialist at this point. We just want the best for our patient. However I'm happy to keep seeing the patient an din no way am saying that we won't - again we just want the best for patient. If she decides to go to another headache specialist, we will definitely provide support in the meantime while they wait for appointment.

## 2018-07-02 NOTE — Telephone Encounter (Signed)
Error

## 2018-09-01 ENCOUNTER — Ambulatory Visit: Payer: No Typology Code available for payment source | Admitting: Neurology

## 2018-09-01 ENCOUNTER — Ambulatory Visit (INDEPENDENT_AMBULATORY_CARE_PROVIDER_SITE_OTHER): Payer: No Typology Code available for payment source | Admitting: Family Medicine

## 2018-09-01 ENCOUNTER — Encounter: Payer: Self-pay | Admitting: Family Medicine

## 2018-09-01 VITALS — BP 105/65 | HR 69 | Ht 67.0 in | Wt 180.8 lb

## 2018-09-01 DIAGNOSIS — R42 Dizziness and giddiness: Secondary | ICD-10-CM

## 2018-09-01 DIAGNOSIS — G43709 Chronic migraine without aura, not intractable, without status migrainosus: Secondary | ICD-10-CM

## 2018-09-01 MED ORDER — RIZATRIPTAN BENZOATE 10 MG PO TBDP: 10.0000 mg | ORAL_TABLET | ORAL | 11 refills | Status: DC | PRN

## 2018-09-01 MED ORDER — GABAPENTIN 100 MG PO CAPS: 100.0000 mg | ORAL_CAPSULE | Freq: Three times a day (TID) | ORAL | 11 refills | Status: DC

## 2018-09-01 NOTE — Progress Notes (Signed)
PATIENT: William Mccall DOB: Apr 26, 1966  REASON FOR VISIT: follow up HISTORY FROM: patient  Chief Complaint  Patient presents with  . Follow-up    Migraine follow up. Alone. New room. Patient mentioned that he has a headache everyday some days are more severe than others. He stated that at present he feels pressure at the back of his head and eyes. He has some dizziness at random and when he changes his position too fast.      HISTORY OF PRESENT ILLNESS: Today 09/01/18 William Mccall is a 53 y.o. male here today for follow up for migraines and dizziness. He is taking Amovig monthly and topiramate 200mg  at bedtime for prevention. He not currently taking abortive therapy. He reports that he "feels like crap" every day. He is having daily headaches. Headaches are always a pressure sensation. It can be all over his head. When headaches get severe he has blurred vision, double vision, light and sound sensitivity and nausea. This happens about three times a week. He is constantly drinking fluids. He feels "whoozy" or off balance. He feels that this occurs most every day. It is worse with position changes or if he turns his head too fast. He was evaluated by Dr Haroldine Laws for similar concerns. He was diagnosed with 'fluid in his ear" and was given antibiotics that helped for about 4-5 months.  He saw another ENT provider at the [facility] who ordered a CT scan of sinuses that was normal. He denies falls.   MRI Brain, CT angio head and neck and LP with cultures were all normal. He is scheduling a MRI of cervical spine with PCP for left neck and shoulder pain.   He is not taking any OTC medications. He is uncertain if Amovig has helped. He is tolerating Amovig and topiramate with no obvious adverse effects.   He had an eye exam in June or July of 2019. He reports that this exam was normal. He has not documented migraines but has made some dietary changes. He as cut back on sodium, stopped drinking coffee, trying  to eat healthier. He has noticed that his blood pressures are better after starting green tea. He has lost 14 pounds since November, 2019. He drinks about 1 drink a month. He denies smoking cigarettes. He has been experimenting with THC but has not noticed much of a difference. He works in Set [employer]. He denies significant stress of depression. He is treated for OSA. He does use CPAP. He switched to nasal mask about a year ago. He is followed annually by the [place].     HISTORY: (copied from Dr Trevor Mace note on 02/24/2018) Interval history 02/24/2018: He reports improvement with increase in Topiramate. He has started having ear pain in the left ear and since then a little worse.  He has a stinging pain on the left side of the head and travels down his jae on the left. He has not seen his pcp to examine his ear.  He is [facility] to [facility]. The ear pain is new. It hurst when he swallow on the left.  H still wakes up with headaches, he is seeing his sleep doctor on the 13th to ensure that cpap machine is setup correctly and heck a report.  Headaches improved, he appears comfortable today and he can go 3 days without significant pain.   Interval history 02/24/2018: Imaging of the brain and LP opening pressure and csf  studies were normal. He is here for follow up of severe headaches. At last appointment he significantly approved with a migraine cocktail.  HPI:  William Mccall is a 53 y.o. male here as a referral for headache.  He is here with his wife. PMHx HTN, OSA on cpap managed by the [facility] but reports mild sleep apnea. Headache started memorial day weekend. He stared having a throbbing headache, left side of the head, severe painful throbbing. He has daily headache, dizziness, nausea, "loopy daily". He feels his peripheral vision is "gone", his vision is blurred, he sees double vision at times offset. If he is laying down in bed he has to close one eye. Hi balance is "ooff" he can just  lose his balance. No inciting events except he started taking Zoloft. He stopped the zoloft 2 days ago. He has light and sound sensitivity, nausea with the headaches. He has to ask people to dim the lights which helps. He wakes with headaches,  No FHx of migraines. Movement makes it worse. No aura. No numbness or tingling. He also has pressure behind his eyes. He has episodes of double vision.  He has had similar headaches in the past, he had the worst headache on memorial day, daily headaches and pressure in his head. He has a 6/10 headache today, appears very uncomfortable. Smokes marijuana, + hx of alcohol use now only social.    Reviewed notes, labs and imaging from outside physicians, which showed:  MRI brain normal, personally reviewed imaging 12/24/2017  CTA head/neck: reviewed reports: Negative exams  CMP elevated ALT 47 otherwise normal, rpr/hiv negative  REVIEW OF SYSTEMS: Out of a complete 14 system review of symptoms, the patient complains only of the following symptoms, appetite ringing in the ears, light sensitivity, activity and appetite change, blurred vision, double vision and all other reviewed systems are negative.  ALLERGIES: No Known Allergies  HOME MEDICATIONS: Outpatient Medications Prior to Visit  Medication Sig Dispense Refill  . amLODipine (NORVASC) 10 MG tablet Take 10 mg by mouth daily.    . baclofen (LIORESAL) 10 MG tablet Take 10 mg by mouth 3 (three) times daily.     Dorise Hiss (AIMOVIG) 140 MG/ML SOAJ Inject 140 mg into the skin every 30 (thirty) days. 1 pen 11  . hydrochlorothiazide (HYDRODIURIL) 25 MG tablet Take 25 mg by mouth daily.    . sertraline (ZOLOFT) 100 MG tablet Take 100 mg by mouth as needed.    . topiramate (TOPAMAX) 100 MG tablet Take 2 tablets (200 mg total) by mouth at bedtime. 60 tablet 11  . traZODone (DESYREL) 50 MG tablet TAKE 1 TABLET BY MOUTH EVERY DAY AT BEDTIME AS NEEDED  2  . sertraline (ZOLOFT) 50 MG tablet Take 50 mg by  mouth daily.    . butalbital-acetaminophen-caffeine (FIORICET, ESGIC) 50-325-40 MG tablet 1 tablet every 6 (six) hours as needed.     No facility-administered medications prior to visit.     PAST MEDICAL HISTORY: Past Medical History:  Diagnosis Date  . Hypertension     PAST SURGICAL HISTORY: Past Surgical History:  Procedure Laterality Date  . HERNIA REPAIR      FAMILY HISTORY: Family History  Problem Relation Age of Onset  . Migraines Neg Hx     SOCIAL HISTORY: Social History   Socioeconomic History  . Marital status: Married    Spouse name: Not on file  . Number of children: 3  . Years of education: Not on file  .  Highest education level: Some college, no degree  Occupational History  . Not on file  Social Needs  . Financial resource strain: Not on file  . Food insecurity:    Worry: Not on file    Inability: Not on file  . Transportation needs:    Medical: Not on file    Non-medical: Not on file  Tobacco Use  . Smoking status: Never Smoker  . Smokeless tobacco: Former Neurosurgeon    Types: Chew  Substance and Sexual Activity  . Alcohol use: Not Currently    Comment: quit July 2019  . Drug use: Not Currently    Types: Marijuana    Comment: quit July 2019  . Sexual activity: Yes  Lifestyle  . Physical activity:    Days per week: Not on file    Minutes per session: Not on file  . Stress: Not on file  Relationships  . Social connections:    Talks on phone: Not on file    Gets together: Not on file    Attends religious service: Not on file    Active member of club or organization: Not on file    Attends meetings of clubs or organizations: Not on file    Relationship status: Not on file  . Intimate partner violence:    Fear of current or ex partner: Not on file    Emotionally abused: Not on file    Physically abused: Not on file    Forced sexual activity: Not on file  Other Topics Concern  . Not on file  Social History Narrative   Lives at home with  his wife   Right handed   Caffeine: 1 cup green tea daily, no longer drinking coffee      PHYSICAL EXAM  Vitals:   09/01/18 0855  BP: 105/65  Pulse: 69  Weight: 180 lb 12.8 oz (82 kg)  Height:  (1.702 m)   Body mass index is 28.32 kg/m.  Generalized: Well developed, in no acute distress  Cardiology: normal rate and rhythm, no murmur noted Neurological examination  Mentation: Alert oriented to time, place, history taking. Follows all commands speech and language fluent Cranial nerve II-XII: Pupils were pinpoint, equal round reactive to light. Extraocular movements were full, visual field were full on confrontational test. Facial sensation and strength were normal. Uvula tongue midline. Head turning and shoulder shrug  were normal and symmetric. Motor: The motor testing reveals 5 over 5 strength of all 4 extremities. Good symmetric motor tone is noted throughout.  Sensory: Sensory testing is intact to soft touch on all 4 extremities. No evidence of extinction is noted.  Coordination: Cerebellar testing reveals good finger-nose-finger bilaterally.  Gait and station: Gait is normal. Romberg is negative. No drift is seen.    DIAGNOSTIC DATA (LABS, IMAGING, TESTING) - I reviewed patient records, labs, notes, testing and imaging myself where available.  No flowsheet data found.   Lab Results  Component Value Date   WBC 6.6 12/24/2017   HGB 13.5 12/24/2017   HCT 43.0 12/24/2017   MCV 98.2 12/24/2017   PLT 251 12/24/2017      Component Value Date/Time   NA 144 02/24/2018 1345   K 3.1 (L) 02/24/2018 1345   CL 102 02/24/2018 1345   CO2 25 02/24/2018 1345   GLUCOSE 83 02/24/2018 1345   GLUCOSE 83 12/24/2017 1036   BUN 14 02/24/2018 1345   CREATININE 1.13 02/24/2018 1345   CALCIUM 9.9 02/24/2018 1345   PROT  7.4 12/24/2017 1036   ALBUMIN 4.2 12/24/2017 1036   AST 40 12/24/2017 1036   ALT 47 (H) 12/24/2017 1036   ALKPHOS 56 12/24/2017 1036   BILITOT 0.7 12/24/2017  1036   GFRNONAA 75 02/24/2018 1345   GFRAA 87 02/24/2018 1345   No results found for: CHOL, HDL, LDLCALC, LDLDIRECT, TRIG, CHOLHDL No results found for: SFKC1E No results found for: VITAMINB12 No results found for: TSH    ASSESSMENT AND PLAN 53 y.o. year old male  has a past medical history of Hypertension. here with     ICD-10-CM   1. Chronic migraine without aura without status migrainosus, not intractable G43.709 Ambulatory referral to Physical Therapy    gabapentin (NEURONTIN) 100 MG capsule    rizatriptan (MAXALT-MLT) 10 MG disintegrating tablet  2. Vertigo R42     Unfortunately he continues to suffer from daily headaches and frequent migraines.  Dizziness has been present for at least a year.  He has had multiple work-ups including MRI, CT angio head and neck, LP as well as seeing multiple providers including neuro, PCP and ENT.  No source of dizziness has been identified.  He does state that dizziness is much worse with turning head side to side.  He did respond well to antibiotics given by Dr. Joneen Roach for fluid in his ears.  We will send him to physical therapy for vestibular exercises.  We will continue Aimovig and topiramate at this time for preventative therapy. I will add gabapentin 100mg  three times daily (slowly titrating up as directed in AVS). Rizatriptan for abortive therapy. Appropriate administration discussed. I have advised considering switching back to full face mask with CPAP to see if this helps. MRI neck with PCP. Advised against alcohol or illegal drug use. He verbalizes understanding and will follow up in 3 months.    Orders Placed This Encounter  Procedures  . Ambulatory referral to Physical Therapy    Referral Priority:   Routine    Referral Type:   Physical Medicine    Referral Reason:   Specialty Services Required    Requested Specialty:   Physical Therapy    Number of Visits Requested:   1     Meds ordered this encounter  Medications  . gabapentin  (NEURONTIN) 100 MG capsule    Sig: Take 1 capsule (100 mg total) by mouth 3 (three) times daily.    Dispense:  90 capsule    Refill:  11    Order Specific Question:   Supervising Provider    Answer:   Anson Fret [id number]  . rizatriptan (MAXALT-MLT) 10 MG disintegrating tablet    Sig: Take 1 tablet (10 mg total) by mouth as needed for migraine. May repeat in 2 hours if needed    Dispense:  9 tablet    Refill:  11    Order Specific Question:   Supervising Provider    Answer:   Anson Fret [[id number]]       VCB SWHQP, FNP-C 09/01/2018, 10:49 AM [facility] [address], [address] [place], [place] [address] [phone]

## 2018-09-01 NOTE — Progress Notes (Signed)
Personally  participated in, made any corrections needed, and agree with history, physical, neuro exam,assessment and plan as stated.     Antonia Ahern, MD [facility]     

## 2018-09-01 NOTE — Patient Instructions (Addendum)
Continue topiramate as prescribed  Continue Amovig as prescribed  Start gabapentin 100mg  three times a day (100mg  once daily for 2-3 days, then 100mg  twice daily for 2-3 days, then 100mg  three times daily)  Start rizatriptan (Maxalt) for abortive therapy (one tablet at onset of headache, may repeat 1 tablet 2 hours later, no more than 2 tablets in 24 hours)  Referral to PT for vestibular therapy    How to Perform the Epley Maneuver The Epley maneuver is an exercise that relieves symptoms of vertigo. Vertigo is the feeling that you or your surroundings are moving when they are not. When you feel vertigo, you may feel like the room is spinning and have trouble walking. Dizziness is a little different than vertigo. When you are dizzy, you may feel unsteady or light-headed. You can do this maneuver at home whenever you have symptoms of vertigo. You can do it up to 3 times a day until your symptoms go away. Even though the Epley maneuver may relieve your vertigo for a few weeks, it is possible that your symptoms will return. This maneuver relieves vertigo, but it does not relieve dizziness. What are the risks? If it is done correctly, the Epley maneuver is considered safe. Sometimes it can lead to dizziness or nausea that goes away after a short time. If you develop other symptoms, such as changes in vision, weakness, or numbness, stop doing the maneuver and call your health care provider. How to perform the Epley maneuver 1. Sit on the edge of a bed or table with your back straight and your legs extended or hanging over the edge of the bed or table. 2. Turn your head halfway toward the affected ear or side. 3. Lie backward quickly with your head turned until you are lying flat on your back. You may want to position a pillow under your shoulders. 4. Hold this position for 30 seconds. You may experience an attack of vertigo. This is normal. 5. Turn your head to the opposite direction until your  unaffected ear is facing the floor. 6. Hold this position for 30 seconds. You may experience an attack of vertigo. This is normal. Hold this position until the vertigo stops. 7. Turn your whole body to the same side as your head. Hold for another 30 seconds. 8. Sit back up. You can repeat this exercise up to 3 times a day. Follow these instructions at home:  After doing the Epley maneuver, you can return to your normal activities.  Ask your health care provider if there is anything you should do at home to prevent vertigo. He or she may recommend that you: ? Keep your head raised (elevated) with two or more pillows while you sleep. ? Do not sleep on the side of your affected ear. ? Get up slowly from bed. ? Avoid sudden movements during the day. ? Avoid extreme head movement, like looking up or bending over. Contact a health care provider if:  Your vertigo gets worse.  You have other symptoms, including: ? Nausea. ? Vomiting. ? Headache. Get help right away if:  You have vision changes.  You have a severe or worsening headache or neck pain.  You cannot stop vomiting.  You have new numbness or weakness in any part of your body. Summary  Vertigo is the feeling that you or your surroundings are moving when they are not.  The Epley maneuver is an exercise that relieves symptoms of vertigo.  If the Epley maneuver is  done correctly, it is considered safe. You can do it up to 3 times a day. This information is not intended to replace advice given to you by your health care provider. Make sure you discuss any questions you have with your health care provider. Document Released: 06/15/2013 Document Revised: 04/30/2016 Document Reviewed: 04/30/2016 Elsevier Interactive Patient Education  2019 [employer].   Vertigo  Vertigo means that you feel like you are moving when you are not. Vertigo can also make you feel like things around you are moving when they are not. This feeling  can come and go at any time. Vertigo often goes away on its own. Follow these instructions at home:  Avoid making fast movements.  Avoid driving.  Avoid using heavy machinery.  Avoid doing any task or activity that might cause danger to you or other people if you would have a vertigo attack while you are doing it.  Sit down right away if you feel dizzy or have trouble with your balance.  Take over-the-counter and prescription medicines only as told by your doctor.  Follow instructions from your doctor about which positions or movements you should avoid.  Drink enough fluid to keep your pee (urine) clear or pale yellow.  Keep all follow-up visits as told by your doctor. This is important. Contact a doctor if:  Medicine does not help your vertigo.  You have a fever.  Your problems get worse or you have new symptoms.  Your family or friends see changes in your behavior.  You feel sick to your stomach (nauseous) or you throw up (vomit).  You have a "pins and needles" feeling or you are numb in part of your body. Get help right away if:  You have trouble moving or talking.  You are always dizzy.  You pass out (faint).  You get very bad headaches.  You feel weak or have trouble using your hands, arms, or legs.  You have changes in your hearing.  You have changes in your seeing (vision).  You get a stiff neck.  Bright light starts to bother you. This information is not intended to replace advice given to you by your health care provider. Make sure you discuss any questions you have with your health care provider. Document Released: 03/19/2008 Document Revised: 11/16/2015 Document Reviewed: 10/03/2014 Elsevier Interactive Patient Education  [employer].

## 2018-09-03 ENCOUNTER — Telehealth: Payer: Self-pay | Admitting: Family Medicine

## 2018-09-03 DIAGNOSIS — G44229 Chronic tension-type headache, not intractable: Secondary | ICD-10-CM

## 2018-09-03 NOTE — Telephone Encounter (Signed)
I received a call from Dr Hollace Hayward, ENT at [facility] regarding mutual patient. He was inquiring about CTA Head and Neck as well as MRI cervical spine due to patient concerns of neck pain and dizziness. Dr Hollace Hayward was made aware that CTA Head and Neck were normal in 12/2017 at initial workup for these symptoms. Patient reported to me on 09/01/2018 that he was scheduling an MRI cervical spine ordered by PCP for neck pain. I called to speak to patient regarding this as Dr Hollace Hayward was under the impression that we were ordering this test. Patient is requesting that we order MRI neck. Order will be placed and patient will be notified to set up imaging. We will follow up pending results.

## 2018-09-07 ENCOUNTER — Telehealth: Payer: Self-pay | Admitting: Family Medicine

## 2018-09-07 NOTE — Telephone Encounter (Signed)
[employer] order sent to [facility]. They will obtain the auth and reach out to the pt to schedule.

## 2018-10-01 ENCOUNTER — Ambulatory Visit
Admission: RE | Admit: 2018-10-01 | Discharge: 2018-10-01 | Disposition: A | Discharge status: Home / Self Care | Payer: 59 | Source: Ambulatory Visit | Attending: Family Medicine | Admitting: Family Medicine

## 2018-10-01 ENCOUNTER — Other Ambulatory Visit: Payer: Self-pay

## 2018-10-01 DIAGNOSIS — G44229 Chronic tension-type headache, not intractable: Secondary | ICD-10-CM

## 2018-11-05 ENCOUNTER — Ambulatory Visit: Payer: Non-veteran care | Admitting: Neurology

## 2019-02-10 ENCOUNTER — Telehealth: Payer: Self-pay | Admitting: Family Medicine

## 2019-02-10 NOTE — Telephone Encounter (Signed)
I called patient and LVM regarding rescheduling 9/10 appt due to NP time off. Requested patient call back to r/s. °

## 2019-02-16 ENCOUNTER — Encounter: Payer: Self-pay | Admitting: Family Medicine

## 2019-03-04 ENCOUNTER — Ambulatory Visit: Payer: Non-veteran care | Admitting: Family Medicine

## 2019-06-16 ENCOUNTER — Encounter: Payer: Self-pay | Admitting: Family Medicine

## 2019-06-16 ENCOUNTER — Ambulatory Visit (INDEPENDENT_AMBULATORY_CARE_PROVIDER_SITE_OTHER): Payer: No Typology Code available for payment source | Admitting: Family Medicine

## 2019-06-16 ENCOUNTER — Other Ambulatory Visit: Payer: Self-pay

## 2019-06-16 ENCOUNTER — Telehealth: Payer: Self-pay

## 2019-06-16 VITALS — BP 144/79 | HR 88 | Temp 97.0°F | Ht 67.0 in | Wt 197.2 lb

## 2019-06-16 DIAGNOSIS — G43709 Chronic migraine without aura, not intractable, without status migrainosus: Secondary | ICD-10-CM | POA: Diagnosis not present

## 2019-06-16 DIAGNOSIS — G43001 Migraine without aura, not intractable, with status migrainosus: Secondary | ICD-10-CM | POA: Diagnosis not present

## 2019-06-16 MED ORDER — AIMOVIG 140 MG/ML ~~LOC~~ SOAJ: 140.0000 mg | SUBCUTANEOUS | 11 refills | Status: AC

## 2019-06-16 MED ORDER — TOPIRAMATE 100 MG PO TABS: 100.0000 mg | ORAL_TABLET | Freq: Every day | ORAL | 11 refills | Status: AC

## 2019-06-16 MED ORDER — RIZATRIPTAN BENZOATE 10 MG PO TBDP: 10.0000 mg | ORAL_TABLET | ORAL | 11 refills | Status: AC | PRN

## 2019-06-16 MED ORDER — GABAPENTIN 100 MG PO CAPS: 100.0000 mg | ORAL_CAPSULE | Freq: Three times a day (TID) | ORAL | 11 refills | Status: AC

## 2019-06-16 NOTE — Patient Instructions (Signed)
We will restart topiramate at 100mg  daily (start 50mg  daily for 1 week then increase)  Continue Amovig and gabapentin as prescribed   Continue close follow up with PCP (HTN and CPAP)    Migraine Headache A migraine headache is a very strong throbbing pain on one side or both sides of your head. This type of headache can also cause other symptoms. It can last from 4 hours to 3 days. Talk with your doctor about what things may bring on (trigger) this condition. What are the causes? The exact cause of this condition is not known. This condition may be triggered or caused by:  Drinking alcohol.  Smoking.  Taking medicines, such as: ? Medicine used to treat chest pain (nitroglycerin). ? Birth control pills. ? Estrogen. ? Some blood pressure medicines.  Eating or drinking certain products.  Doing physical activity. Other things that may trigger a migraine headache include:  Having a menstrual period.  Pregnancy.  Hunger.  Stress.  Not getting enough sleep or getting too much sleep.  Weather changes.  Tiredness (fatigue). What increases the risk?  Being 31-76 years old.  Being male.  Having a family history of migraine headaches.  Being Caucasian.  Having depression or anxiety.  Being very overweight. What are the signs or symptoms?  A throbbing pain. This pain may: ? Happen in any area of the head, such as on one side or both sides. ? Make it hard to do daily activities. ? Get worse with physical activity. ? Get worse around bright lights or loud noises.  Other symptoms may include: ? Feeling sick to your stomach (nauseous). ? Vomiting. ? Dizziness. ? Being sensitive to bright lights, loud noises, or smells.  Before you get a migraine headache, you may get warning signs (an aura). An aura may include: ? Seeing flashing lights or having blind spots. ? Seeing bright spots, halos, or zigzag lines. ? Having tunnel vision or blurred vision. ? Having  numbness or a tingling feeling. ? Having trouble talking. ? Having weak muscles.  Some people have symptoms after a migraine headache (postdromal phase), such as: ? Tiredness. ? Trouble thinking (concentrating). How is this treated?  Taking medicines that: ? Relieve pain. ? Relieve the feeling of being sick to your stomach. ? Prevent migraine headaches.  Treatment may also include: ? Having acupuncture. ? Avoiding foods that bring on migraine headaches. ? Learning ways to control your body functions (biofeedback). ? Therapy to help you know and deal with negative thoughts (cognitive behavioral therapy). Follow these instructions at home: Medicines  Take over-the-counter and prescription medicines only as told by your doctor.  Ask your doctor if the medicine prescribed to you: ? Requires you to avoid driving or using heavy machinery. ? Can cause trouble pooping (constipation). You may need to take these steps to prevent or treat trouble pooping:  Drink enough fluid to keep your pee (urine) pale yellow.  Take over-the-counter or prescription medicines.  Eat foods that are high in fiber. These include beans, whole grains, and fresh fruits and vegetables.  Limit foods that are high in fat and sugar. These include fried or sweet foods. Lifestyle  Do not drink alcohol.  Do not use any products that contain nicotine or tobacco, such as cigarettes, e-cigarettes, and chewing tobacco. If you need help quitting, ask your doctor.  Get at least 8 hours of sleep every night.  Limit and deal with stress. General instructions      Keep a journal to  find out what may bring on your migraine headaches. For example, write down: ? What you eat and drink. ? How much sleep you get. ? Any change in what you eat or drink. ? Any change in your medicines.  If you have a migraine headache: ? Avoid things that make your symptoms worse, such as bright lights. ? It may help to lie down in a  dark, quiet room. ? Do not drive or use heavy machinery. ? Ask your doctor what activities are safe for you.  Keep all follow-up visits as told by your doctor. This is important. Contact a doctor if:  You get a migraine headache that is different or worse than others you have had.  You have more than 15 headache days in one month. Get help right away if:  Your migraine headache gets very bad.  Your migraine headache lasts longer than 72 hours.  You have a fever.  You have a stiff neck.  You have trouble seeing.  Your muscles feel weak or like you cannot control them.  You start to lose your balance a lot.  You start to have trouble walking.  You pass out (faint).  You have a seizure. Summary  A migraine headache is a very strong throbbing pain on one side or both sides of your head. These headaches can also cause other symptoms.  This condition may be treated with medicines and changes to your lifestyle.  Keep a journal to find out what may bring on your migraine headaches.  Contact a doctor if you get a migraine headache that is different or worse than others you have had.  Contact your doctor if you have more than 15 headache days in a month. This information is not intended to replace advice given to you by your health care provider. Make sure you discuss any questions you have with your health care provider. Document Released: 03/19/2008 Document Revised: 10/02/2018 Document Reviewed: 07/23/2018 Elsevier Patient Education  2020 [employer].

## 2019-06-16 NOTE — Progress Notes (Signed)
PATIENT: William Mccall DOB: December 06, 1965  REASON FOR VISIT: follow up HISTORY FROM: patient  Chief Complaint  Patient presents with  . Follow-up    RM4. alone. [facility]. Had a bad migraine last night. in the last 2-3 months only had 3 bad migraines.     HISTORY OF PRESENT ILLNESS: Today 06/16/19 William Mccall is a 53 y.o. male here today for follow up for migraines and dizziness. MRI cervical spine normal. PT advised at last visit. He was taking topiramate but weaned this medication as he was feeling better. He continues Amovig and gabapentin 100mg  TID. Dizziness has improved. He did not go to vestibular therapy as directed at last visit. Overall headaches have improved as well. He has noticed over the past 3-4 months that headaches are returning, although, he has only had 2-3 "severe" headaches. He does have some sort of mild headache most days.   He is followed by the [place]. He reports that the [place] sees him yearly for CPAP compliance and HTN. He is taking hydrochlorothiazide daily as prescribed. He reports that baseline BP is 140's/80's. He is taking Zoloft 150mg  and Abilify (newly added) for mood. Trazodone helps with sleep but he is not taking it everyday. He does have trouble sleeping. He reports waking multiple times a night. He reports that last compliance visit was normal about 8 months ago. He reports that he was also fitted for an oral appliance that he is using with CPAP.   HISTORY: (copied from my note on 09/01/2018)  William Mccall is a 53 y.o. male here today for follow up for migraines and dizziness. He is taking Amovig monthly and topiramate 200mg  at bedtime for prevention. He not currently taking abortive therapy. He reports that he "feels like crap" every day. He is having daily headaches. Headaches are always a pressure sensation. It can be all over his head. When headaches get severe he has blurred vision, double vision, light and sound sensitivity and nausea. This happens  about three times a week. He is constantly drinking fluids. He feels "whoozy" or off balance. He feels that this occurs most every day. It is worse with position changes or if he turns his head too fast. He was evaluated by Dr Ernesto Rutherford for similar concerns. He was diagnosed with 'fluid in his ear" and was given antibiotics that helped for about 4-5 months.  He saw another E[facility] provider at the [facility] who ordered a CT scan of sinuses that was normal. He denies falls.   MRI Brain, CT angio head and neck and LP with cultures were all normal. He is scheduling a MRI of cervical spine with PCP for left neck and shoulder pain.   He is not taking any OTC medications. He is uncertain if Amovig has helped. He is tolerating Amovig and topiramate with no obvious adverse effects.   He had an eye exam in June or July of 2019. He reports that this exam was normal. He has not documented migraines but has made some dietary changes. He as cut back on sodium, stopped drinking coffee, trying to eat healthier. He has noticed that his blood pressures are better after starting green tea. He has lost 14 pounds since November, 2019. He drinks about 1 drink a month. He denies smoking cigarettes. He has been experimenting with THC but has not noticed much of a difference. He works in Psychologist, educational. He denies significant stress of depression. He is treated for OSA. He does  use CPAP. He switched to nasal mask about a year ago. He is followed annually by the [place].     HISTORY: (copied from Dr Trevor Mace note on 02/24/2018) Interval history 02/24/2018: He reports improvement with increase in Topiramate. He has started having ear pain in the left ear and since then a little worse. He has a stinging pain on the left side of the head and travels down his jae on the left. He has not seen his pcp to examine his ear. He is trying to [facility]. The ear pain is new. It hurst when he swallow on the left. H still wakes  up with headaches, he is seeing his sleep doctor on the 13th to ensure that cpap machine is setup correctly and heck a report. Headaches improved, he appears comfortable today and he can go 3 days without significant pain.   Interval history 02/24/2018: Imaging of the brain and LP opening pressure and csf studies were normal. He is here for follow up of severe headaches. At last appointment he significantly approved with a migraine cocktail.  HPI:William K Brooksis a 53 y.o.malehere as a referral for headache. He is here with his wife. PMHx HTN, OSA on cpap managed by the [facility] but reports mild sleep apnea. Headache started memorial day weekend. He stared having a throbbing headache, left side of the head, severe painful throbbing. He has daily headache, dizziness, nausea, "loopy daily". He feels his peripheral vision is "gone", his vision is blurred, he sees double vision at times offset. If he is laying down in bed he has to close one eye. Hi balance is "ooff" he can just lose his balance. No inciting events except he started taking Zoloft. He stopped the zoloft 2 days ago. He has light and sound sensitivity, nausea with the headaches. He has to ask people to dim the lights which helps. He wakes with headaches, No FHx of migraines. Movement makes it worse. No aura. No numbness or tingling. He also has pressure behind his eyes. He has episodes of double vision. He has had similar headaches in the past, he had the worst headache on memorial day, daily headaches and pressure in his head. He has a 6/10 headache today, appears very uncomfortable. Smokes marijuana, + hx of alcohol use now only social.    Reviewed notes, labs and imaging from outside physicians, which showed:  MRI brain normal, personally reviewed imaging 12/24/2017  CTA head/neck: reviewed reports: Negative exams  CMP elevated ALT 47 otherwise normal, rpr/hiv negative   REVIEW OF SYSTEMS: Out of a complete 14 system review of  symptoms, the patient complains only of the following symptoms, none and all other reviewed systems are negative.  ALLERGIES: No Known Allergies  HOME MEDICATIONS: Outpatient Medications Prior to Visit  Medication Sig Dispense Refill  . amLODipine (NORVASC) 10 MG tablet Take 10 mg by mouth daily.    . hydrochlorothiazide (HYDRODIURIL) 25 MG tablet Take 25 mg by mouth daily.    . sertraline (ZOLOFT) 100 MG tablet Take 100 mg by mouth as needed.    . traZODone (DESYREL) 50 MG tablet TAKE 1 TABLET BY MOUTH EVERY DAY AT BEDTIME AS NEEDED  2  . Erenumab-aooe (AIMOVIG) 140 MG/ML SOAJ Inject 140 mg into the skin every 30 (thirty) days. 1 pen 11  . gabapentin (NEURONTIN) 100 MG capsule Take 1 capsule (100 mg total) by mouth 3 (three) times daily. 90 capsule 11  . rizatriptan (MAXALT-MLT)  10 MG disintegrating tablet Take 1 tablet (10 mg total) by mouth as needed for migraine. May repeat in 2 hours if needed 9 tablet 11  . baclofen (LIORESAL) 10 MG tablet Take 10 mg by mouth 3 (three) times daily.     Marland Kitchen topiramate (TOPAMAX) 100 MG tablet Take 2 tablets (200 mg total) by mouth at bedtime. (Patient not taking: Reported on 06/16/2019) 60 tablet 11   No facility-administered medications prior to visit.    PAST MEDICAL HISTORY: Past Medical History:  Diagnosis Date  . Hypertension     PAST SURGICAL HISTORY: Past Surgical History:  Procedure Laterality Date  . HERNIA REPAIR      FAMILY HISTORY: Family History  Problem Relation Age of Onset  . Migraines Neg Hx     SOCIAL HISTORY: Social History   Socioeconomic History  . Marital status: Married    Spouse name: Not on file  . Number of children: 3  . Years of education: Not on file  . Highest education level: Some college, no degree  Occupational History  . Not on file  Tobacco Use  . Smoking status: Never Smoker  . Smokeless tobacco: Former Neurosurgeon    Types: Chew  Substance and Sexual Activity  . Alcohol use: Not Currently     Comment: quit July 2019  . Drug use: Not Currently    Types: Marijuana    Comment: quit July 2019  . Sexual activity: Yes  Other Topics Concern  . Not on file  Social History Narrative   Lives at home with his wife   Right handed   Caffeine: 1 cup green tea daily, no longer drinking coffee   Social Determinants of Health   Financial Resource Strain:   . Difficulty of Paying Living Expenses: Not on file  Food Insecurity:   . Worried About Programme researcher, broadcasting/film/video in the Last Year: Not on file  . Ran Out of Food in the Last Year: Not on file  Transportation Needs:   . Lack of Transportation (Medical): Not on file  . Lack of Transportation (Non-Medical): Not on file  Physical Activity:   . Days of Exercise per Week: Not on file  . Minutes of Exercise per Session: Not on file  Stress:   . Feeling of Stress : Not on file  Social Connections:   . Frequency of Communication with Friends and Family: Not on file  . Frequency of Social Gatherings with Friends and Family: Not on file  . Attends Religious Services: Not on file  . Active Member of Clubs or Organizations: Not on file  . Attends Banker Meetings: Not on file  . Marital Status: Not on file  Intimate Partner Violence:   . Fear of Current or Ex-Partner: Not on file  . Emotionally Abused: Not on file  . Physically Abused: Not on file  . Sexually Abused: Not on file      PHYSICAL EXAM  Vitals:   06/16/19 1318  BP: (!) 144/79  Pulse: 88  Temp: (!) 97 F (36.1 C)  Weight: 197 lb 3.2 oz (89.4 kg)  Height:  (1.702 m)   Body mass index is 30.89 kg/m.  Generalized: Well developed, in no acute distress  Cardiology: normal rate and rhythm, no murmur noted Respiratory: clear to auscultation bilaterally  Neurological examination  Mentation: Alert oriented to time, place, history taking. Follows all commands speech and language fluent Cranial nerve II-XII: Pupils were equal round reactive to light.  Extraocular movements were full, visual field were full on confrontational test. Facial sensation and strength were normal. Uvula tongue midline. Head turning and shoulder shrug  were normal and symmetric. Motor: The motor testing reveals 5 over 5 strength of all 4 extremities. Good symmetric motor tone is noted throughout.  Sensory: Sensory testing is intact to soft touch on all 4 extremities. No evidence of extinction is noted.  Coordination: Cerebellar testing reveals good finger-nose-finger and heel-to-shin bilaterally.  Gait and station: Gait is normal.  Reflexes: Deep tendon reflexes are symmetric and normal bilaterally.   DIAGNOSTIC DATA (LABS, IMAGING, TESTING) - I reviewed patient records, labs, notes, testing and imaging myself where available.  No flowsheet data found.   Lab Results  Component Value Date   WBC 6.6 12/24/2017   HGB 13.5 12/24/2017   HCT 43.0 12/24/2017   MCV 98.2 12/24/2017   PLT 251 12/24/2017      Component Value Date/Time   NA 144 02/24/2018 1345   K 3.1 (L) 02/24/2018 1345   CL 102 02/24/2018 1345   CO2 25 02/24/2018 1345   GLUCOSE 83 02/24/2018 1345   GLUCOSE 83 12/24/2017 1036   BUN 14 02/24/2018 1345   CREATININE 1.13 02/24/2018 1345   CALCIUM 9.9 02/24/2018 1345   PROT 7.4 12/24/2017 1036   ALBUMIN 4.2 12/24/2017 1036   AST 40 12/24/2017 1036   ALT 47 (H) 12/24/2017 1036   ALKPHOS 56 12/24/2017 1036   BILITOT 0.7 12/24/2017 1036   GFRNONAA 75 02/24/2018 1345   GFRAA 87 02/24/2018 1345   No results found for: CHOL, HDL, LDLCALC, LDLDIRECT, TRIG, CHOLHDL No results found for: UYQI3K No results found for: VITAMINB12 No results found for: TSH     ASSESSMENT AND PLAN 53 y.o. year old male  has a past medical history of Hypertension. here with     ICD-10-CM   1. Migraine without aura and with status migrainosus, not intractable  G43.001 Erenumab-aooe (AIMOVIG) 140 MG/ML SOAJ    gabapentin (NEURONTIN) 100 MG capsule    rizatriptan  (MAXALT-MLT) 10 MG disintegrating tablet  2. Chronic migraine without aura without status migrainosus, not intractable  G43.709 Erenumab-aooe (AIMOVIG) 140 MG/ML SOAJ    gabapentin (NEURONTIN) 100 MG capsule    rizatriptan (MAXALT-MLT) 10 MG disintegrating tablet    Jasmon reports that headaches have improved significantly since last being seen in March.  He had weaned off of topiramate completely.  Since discontinuation, he has noted some worsening in headaches.  He wishes to restart topiramate.  We will start 100 mg at night.  He will continue Aimovig monthly and gabapentin 100 mg 3 times daily.  Adequate hydration well-balanced diet advised.  He continues to have some difficulty sleeping.  With reports of frequent waking, I am concerned about effectiveness of CPAP.  He also states that he is using CPAP and an oral appliance at the same time.  I have requested notes from his [place] provider and will review these for CPAP compliance as well as blood pressure management.  Dizziness has improved significantly.  He did not feel vestibular therapy was warranted.  Imaging has been unremarkable.  He will continue close follow-up with primary care.  He will follow-up in 1 year, sooner if needed.  He verbalizes understanding and agreement with this plan.   No orders of the defined types were placed in this encounter.    Meds ordered this encounter  Medications  . topiramate (TOPAMAX) 100 MG tablet    Sig: Take  1 tablet (100 mg total) by mouth at bedtime.    Dispense:  30 tablet    Refill:  11    Order Specific Question:   Supervising Provider    Answer:   Anson FretAHERN, ANTONIA B [id number]]  . Erenumab-aooe (AIMOVIG) 140 MG/ML SOAJ    Sig: Inject 140 mg into the skin every 30 (thirty) days.    Dispense:  1 pen    Refill:  11    Order Specific Question:   Supervising Provider    Answer:   Anson FretAHERN, ANTONIA B [id number]]  . gabapentin (NEURONTIN) 100 MG capsule    Sig: Take 1 capsule (100 mg total) by mouth 3 (three)  times daily.    Dispense:  90 capsule    Refill:  11    Order Specific Question:   Supervising Provider    Answer:   Anson FretAHERN, ANTONIA B [id number]]  . rizatriptan (MAXALT-MLT) 10 MG disintegrating tablet    Sig: Take 1 tablet (10 mg total) by mouth as needed for migraine. May repeat in 2 hours if needed    Dispense:  9 tablet    Refill:  11    Order Specific Question:   Supervising Provider    Answer:   Anson FretAHERN, ANTONIA B [id number]]      I spent 15 minutes with the patient. 50% of this time was spent counseling and educating patient on plan of care and medications.    Shawnie Dappermy Lige Lakeman, FNP-C 06/16/2019, 2:33 PM [facility] [address], [address] [place], [place] [address] [phone]

## 2019-06-16 NOTE — Progress Notes (Signed)
I have read the note, and I agree with the clinical assessment and plan.  Mitch Arquette K Clorene Nerio   

## 2019-06-16 NOTE — Telephone Encounter (Signed)
Per NP request. Faxed over pt signed request for pt pcp to request recent office note, med list and previous CPAP report.  Unsure of pts abilify dose. Unaware of proper sertraline dose. Would like to track pts B/P Would like pts recent CPAP   Pt states that he goes through the [place] for most of his meds ands visits.   Awaiting fax back pf information.

## 2019-06-21 DIAGNOSIS — Z0271 Encounter for disability determination: Secondary | ICD-10-CM

## 2019-06-21 NOTE — Telephone Encounter (Signed)
Pts has multiple PCPs listed under his snapshot. Called pt for clarity. Pt states that he goes to the [place] in [place] and his PCP there is Dr.Basrai.   Phone: [phone] Will call tomorrow to obtain a fax number.

## 2019-06-22 NOTE — Telephone Encounter (Signed)
Contacted facility staff was out at lunch.  Informed them of the information that we would be requesting.   Fax Number: [phone]   His PCP is Dr.Basrai , [place].

## 2019-06-22 NOTE — Telephone Encounter (Signed)
Faxed over request awaiting response.

## 2019-06-23 NOTE — Telephone Encounter (Signed)
Received records for pt from [place].  Placed on AL/NP desk.

## 2020-01-03 IMAGING — CT CT ANGIO NECK
1 of 12 series · 5 of 33 positions shown · IV contrast (OMNI 350)
Comparison: None.

CLINICAL DATA: Headache, focal deficit, or papilledema. Chart
review notes persistent headache that has fluctuated since memorial
day.

EXAM:
CT ANGIOGRAPHY HEAD AND NECK
TECHNIQUE: Multidetector CT imaging of the head and neck was performed using
the standard protocol during bolus administration of intravenous
contrast. Multiplanar CT image reconstructions and MIPs were
obtained to evaluate the vascular anatomy. Carotid stenosis
measurements (when applicable) are obtained utilizing NASCET
criteria, using the distal internal carotid diameter as the
denominator.
CONTRAST:  50mL U1HRXT-3TS IOPAMIDOL (U1HRXT-3TS) INJECTION 76%

[Series 12: cta neck axial · axial · 0.39mm/px · z∈[-220,-4]mm · 5 of 326 slices shown]
[im 55/326  soft-tissue]
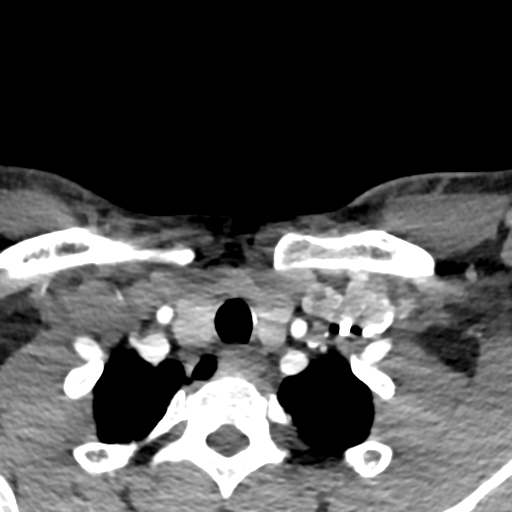
[im 109/326  bone]
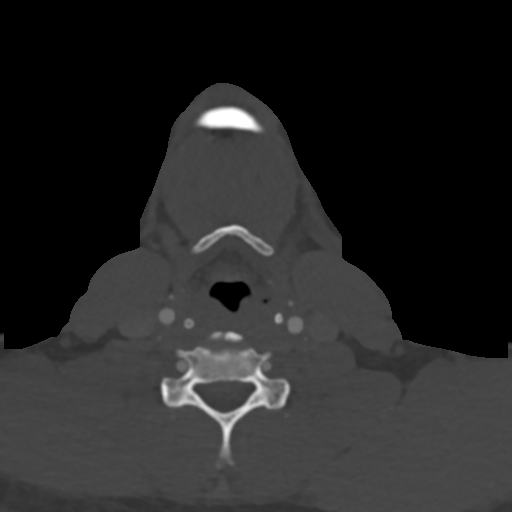
[im 163/326  soft-tissue]
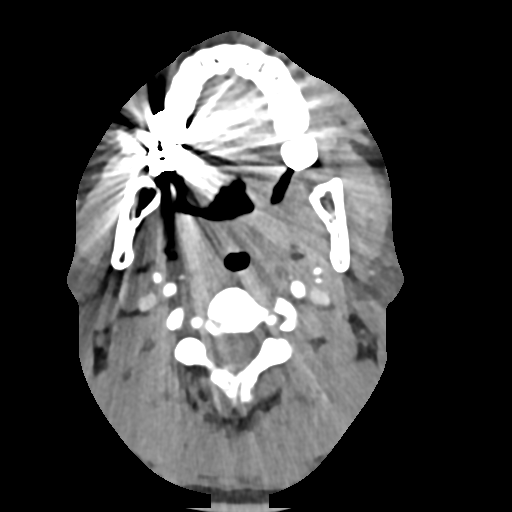
[im 217/326  bone]
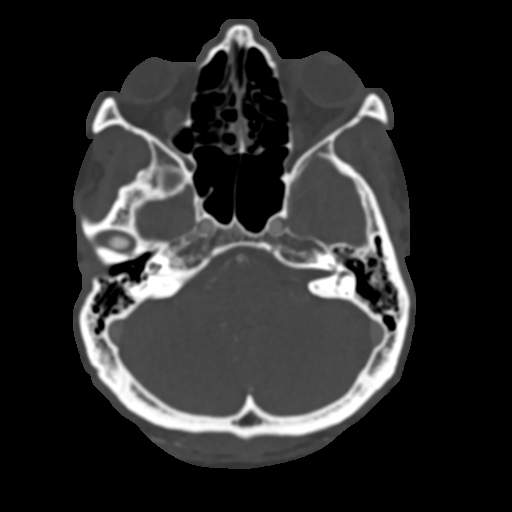
[im 271/326  soft-tissue]
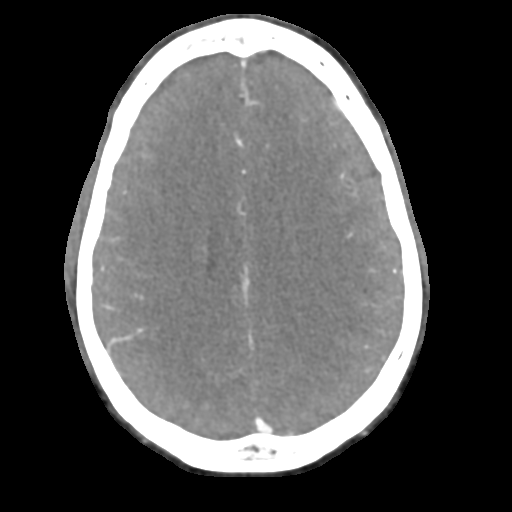

[5 of 33 positions shown; findings below may reference images not displayed]

FINDINGS: CT HEAD FINDINGS

Brain: No evidence of acute infarction, hemorrhage, hydrocephalus,
extra-axial collection or mass lesion/mass effect. Scattered dural
calcifications, nonspecific.

Vascular: See below

Skull: Negative

Sinuses: Clear visualized sinuses.

Orbits: Negative

CTA NECK FINDINGS

Aortic arch: Normal where minimally covered. Two vessel branching
pattern.

Right carotid system: Vessels are smooth and widely patent. No
atheromatous changes.

Left carotid system: Vessels are smooth and widely patent. Minimal
calcified plaque at the common carotid bifurcation.

Vertebral arteries: No proximal subclavian stenosis. Both vertebral
arteries are smooth and widely patent.

Skeleton: No acute or aggressive finding.

Other neck: Incidental tracheal diverticulum rightward at the
thoracic inlet.

Upper chest: Negative

Review of the MIP images confirms the above findings

CTA HEAD FINDINGS

Anterior circulation: Hypoplastic right A1 segment. No branch
occlusion, beading, aneurysm, or stenosis

Posterior circulation: Vertebrobasilar arteries are smooth and
diffusely patent. Robust flow in bilateral posterior cerebral
arteries. Negative for aneurysm or beading

Venous sinuses: Patent

Anatomic variants: As above

Delayed phase: No abnormal intracranial enhancement

Review of the MIP images confirms the above findings
IMPRESSION: Negative exam.  No explanation for headache.

## 2020-02-07 IMAGING — XA DG FLUORO GUIDE LUMBAR PUNCTURE
2 series · 2 of 2 positions shown · non-contrast
Comparison: none

CLINICAL DATA: Headaches. Abnormal vision. Chronic intractable
headache, unspecified headache type.

[Series 1: ortho standard · 1 of 1 slices shown (1 of 2)]
[im 1/1]
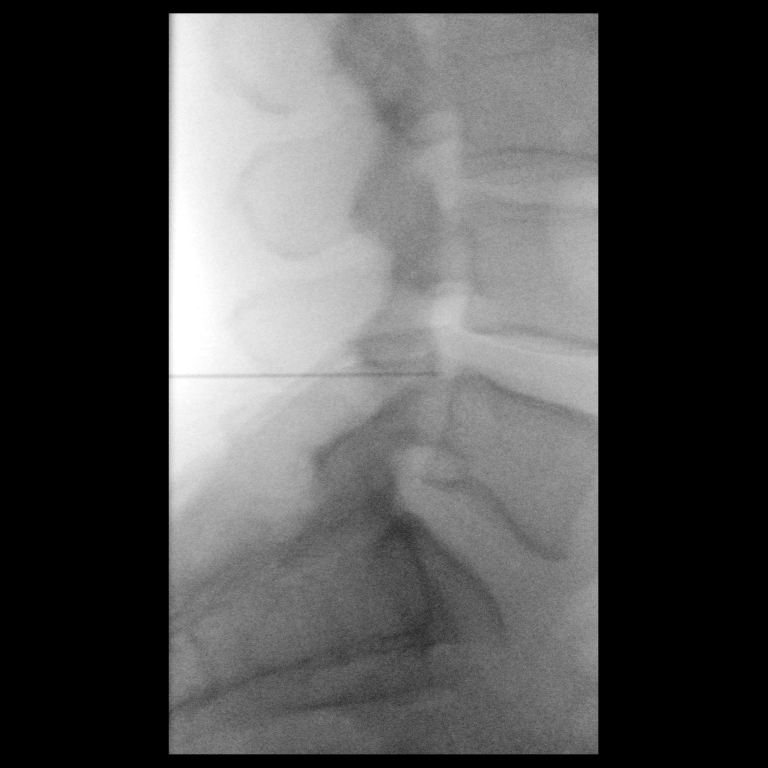

[Series 2: ortho standard · 1 of 1 slices shown (2 of 2)]
[im 1/1]
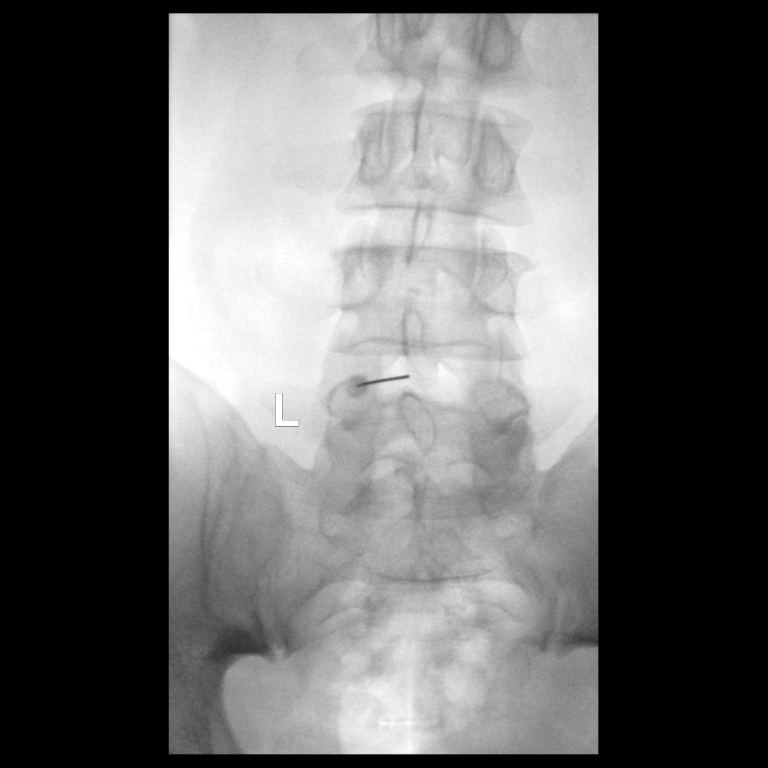

[2 of 2 positions shown; findings below may reference images not displayed]

EXAM:
DIAGNOSTIC LUMBAR PUNCTURE UNDER FLUOROSCOPIC GUIDANCE

FLUOROSCOPY TIME:  Fluoroscopy Time:  8 seconds

Radiation Exposure Index (if provided by the fluoroscopic device):
25.33 uGy*m2

Number of Acquired Spot Images: 0

PROCEDURE:
Informed consent was obtained from the patient prior to the
procedure, including potential complications of headache, allergy,
and pain. With the patient prone, the lower back was prepped with
Betadine. 1% Lidocaine was used for local anesthesia. Lumbar
puncture was performed at the L4-5 level using a 22 gauge needle
with return of clear CSF with an opening pressure of 19.0 cm water.
11.5 ml of CSF were obtained for laboratory studies. Closing
pressure was 13.5 cm water. The patient tolerated the procedure well
and there were no apparent complications.
IMPRESSION: 1. Opening pressure at upper limits of normal, 19.0 cm water
obtained in the left lateral decubitus position.
2. 11.5 cm clear CSF obtained and sent for laboratory testing as
requested.
3. Closing pressure was 13.5 cm water, within normal limits.

## 2020-10-16 DIAGNOSIS — Z0271 Encounter for disability determination: Secondary | ICD-10-CM

## 2022-04-29 ENCOUNTER — Other Ambulatory Visit: Payer: Self-pay | Admitting: *Deleted

## 2022-04-29 NOTE — Patient Outreach (Signed)
  Care Coordination   04/29/2022  Name: William Mccall MRN: [id number] DOB: [birth year]   Care Coordination Outreach Attempts:  An unsuccessful telephone outreach was attempted today to offer the patient information about available care coordination services as a benefit of their health plan. HIPAA compliant messages left on voicemail, providing contact information for CSW, encouraging patient to return CSW's call at his earliest convenience.  Follow Up Plan:  Additional outreach attempts will be made to offer the patient care coordination information and services.   Encounter Outcome:  No Answer.   Care Coordination Interventions Activated:  No.    Care Coordination Interventions:  No, not indicated.    Nat Christen, BSW, MSW, LCSW  Licensed Education officer, [facility]  Mailing [place] [address] [place], [place] [address] Physical Address-[address] [place], [place] [address] Toll Free Main # [phone] Fax # [phone] Cell # [phone] [email or web address]

## 2022-05-03 ENCOUNTER — Encounter: Payer: Self-pay | Admitting: *Deleted

## 2022-05-03 ENCOUNTER — Ambulatory Visit: Payer: Self-pay | Admitting: *Deleted

## 2022-05-03 NOTE — Patient Instructions (Signed)
Visit Information  Thank you for taking time to visit with me today. Please don't hesitate to contact me if I can be of assistance to you.   Please call the care guide team at [phone] if you need to cancel or reschedule your appointment.   If you are experiencing a Mental Health or Behavioral Health Crisis or need someone to talk to, please call the Suicide and Crisis Lifeline: 988 call the [place] National Suicide Prevention Lifeline: 1-800-273-8255 or TTY: 1-800-799-4 TTY (1-800-799-4889) to talk to a trained counselor call 1-800-273-TALK (toll free, 24 hour hotline) go to [facility] [address], [place] ([phone]) call the [place] Crisis Line: 800-939-9988 call 911  Patient verbalizes understanding of instructions and care plan provided today and agrees to view in MyChart. Active MyChart status and patient understanding of how to access instructions and care plan via MyChart confirmed with patient.     No further follow up required.  Katharyn Schauer, BSW, MSW, LCSW  Licensed Clinical Social Worker  [facility] [facility]  Mailing Address-[address], [place], [place] [address] Physical Address-[address], [place], [place] [address] Toll Free Main # [phone] Fax # [phone] Cell # [phone] [email or web address]            

## 2022-05-03 NOTE — Patient Outreach (Signed)
  Care Coordination   Initial Visit Note   05/03/2022  Name: William Mccall MRN: [id number] DOB: Sep 25, 1965  William Mccall is a 56 y.o. year old male who sees Tenna Delaine, MD for primary care. I spoke with Jerline Pain by phone today.  What matters to the patients health and wellness today?   No Interventions Identified.  SDOH assessments and interventions completed:  Yes.  SDOH Interventions Today    Flowsheet Row Most Recent Value  SDOH Interventions   Food Insecurity Interventions Intervention Not Indicated  Housing Interventions Intervention Not Indicated  Transportation Interventions Intervention Not Indicated  Utilities Interventions Intervention Not Indicated  Alcohol Usage Interventions Intervention Not Indicated (Score <7)  Financial Strain Interventions Intervention Not Indicated  Physical Activity Interventions Intervention Not Indicated  Stress Interventions Intervention Not Indicated  Social Connections Interventions Intervention Not Indicated     Care Coordination Interventions Activated:  Yes.   Care Coordination Interventions:  Yes, provided.   Follow up plan: No further intervention required.   Encounter Outcome:  Pt. Visit Completed.   Danford Bad, BSW, MSW, LCSW  Licensed Restaurant manager, [employer] [facility]  Mailing [place] [address] [place], [place] [address] Physical Address-[address], [place], [place] [address] Toll Free Main # [phone] Fax # [phone] Cell # [phone] [email or web address]
